# Patient Record
Sex: Female | Born: 1980 | Hispanic: No | Marital: Married | Smoking: Never smoker
Health system: Southern US, Community
[De-identification: ages and names within clinical notes are randomized; demographics above are authoritative.]

## PROBLEM LIST (undated history)

## (undated) DIAGNOSIS — Z789 Other specified health status: Secondary | ICD-10-CM

## (undated) HISTORY — PX: TOOTH EXTRACTION: SHX859

## (undated) HISTORY — PX: HAND SURGERY: SHX662

---

## 2006-06-09 ENCOUNTER — Inpatient Hospital Stay (HOSPITAL_COMMUNITY): Admission: AD | Admit: 2006-06-09 | Discharge: 2006-06-11 | Payer: Self-pay | Admitting: Gynecology

## 2006-06-09 ENCOUNTER — Ambulatory Visit: Payer: Self-pay | Admitting: Certified Nurse Midwife

## 2007-05-26 ENCOUNTER — Emergency Department (HOSPITAL_COMMUNITY): Admission: EM | Admit: 2007-05-26 | Discharge: 2007-05-26 | Payer: Self-pay | Admitting: Emergency Medicine

## 2008-01-28 ENCOUNTER — Inpatient Hospital Stay (HOSPITAL_COMMUNITY): Admission: AD | Admit: 2008-01-28 | Discharge: 2008-01-28 | Payer: Self-pay | Admitting: Family Medicine

## 2008-04-16 ENCOUNTER — Ambulatory Visit (HOSPITAL_COMMUNITY): Admission: RE | Admit: 2008-04-16 | Discharge: 2008-04-16 | Payer: Self-pay | Admitting: Family Medicine

## 2008-04-22 ENCOUNTER — Ambulatory Visit: Payer: Self-pay | Admitting: Obstetrics & Gynecology

## 2008-05-13 ENCOUNTER — Ambulatory Visit: Payer: Self-pay | Admitting: Family Medicine

## 2008-05-20 ENCOUNTER — Ambulatory Visit: Payer: Self-pay | Admitting: Family Medicine

## 2008-05-25 ENCOUNTER — Ambulatory Visit (HOSPITAL_COMMUNITY): Admission: RE | Admit: 2008-05-25 | Discharge: 2008-05-25 | Payer: Self-pay | Admitting: Family Medicine

## 2008-06-03 ENCOUNTER — Ambulatory Visit: Payer: Self-pay | Admitting: Family Medicine

## 2008-06-17 ENCOUNTER — Ambulatory Visit: Payer: Self-pay | Admitting: Obstetrics & Gynecology

## 2008-07-01 ENCOUNTER — Ambulatory Visit (HOSPITAL_COMMUNITY): Admission: RE | Admit: 2008-07-01 | Discharge: 2008-07-01 | Payer: Self-pay | Admitting: Obstetrics & Gynecology

## 2008-07-08 ENCOUNTER — Ambulatory Visit: Payer: Self-pay | Admitting: Obstetrics & Gynecology

## 2008-07-08 LAB — CONVERTED CEMR LAB
Hemoglobin: 13.1 g/dL (ref 12.0–15.0)
RBC: 4.24 M/uL (ref 3.87–5.11)
WBC: 9 10*3/uL (ref 4.0–10.5)

## 2008-07-22 ENCOUNTER — Ambulatory Visit: Payer: Self-pay | Admitting: Obstetrics & Gynecology

## 2008-07-26 ENCOUNTER — Ambulatory Visit: Payer: Self-pay | Admitting: Obstetrics & Gynecology

## 2008-07-26 ENCOUNTER — Ambulatory Visit (HOSPITAL_COMMUNITY): Admission: RE | Admit: 2008-07-26 | Discharge: 2008-07-26 | Payer: Self-pay | Admitting: Obstetrics & Gynecology

## 2008-07-29 ENCOUNTER — Ambulatory Visit: Payer: Self-pay | Admitting: Obstetrics & Gynecology

## 2008-08-02 ENCOUNTER — Ambulatory Visit: Payer: Self-pay | Admitting: Obstetrics & Gynecology

## 2008-08-05 ENCOUNTER — Ambulatory Visit: Payer: Self-pay | Admitting: Obstetrics & Gynecology

## 2008-08-05 ENCOUNTER — Ambulatory Visit (HOSPITAL_COMMUNITY): Admission: RE | Admit: 2008-08-05 | Discharge: 2008-08-05 | Payer: Self-pay | Admitting: Family Medicine

## 2008-08-16 ENCOUNTER — Ambulatory Visit: Payer: Self-pay | Admitting: Obstetrics & Gynecology

## 2008-08-19 ENCOUNTER — Ambulatory Visit: Payer: Self-pay | Admitting: Obstetrics & Gynecology

## 2008-08-23 ENCOUNTER — Ambulatory Visit: Payer: Self-pay | Admitting: Obstetrics & Gynecology

## 2008-08-25 ENCOUNTER — Ambulatory Visit: Payer: Self-pay | Admitting: Obstetrics & Gynecology

## 2008-08-25 ENCOUNTER — Inpatient Hospital Stay (HOSPITAL_COMMUNITY): Admission: AD | Admit: 2008-08-25 | Discharge: 2008-08-28 | Payer: Self-pay | Admitting: Obstetrics and Gynecology

## 2008-08-26 ENCOUNTER — Encounter: Payer: Self-pay | Admitting: Obstetrics and Gynecology

## 2008-10-12 ENCOUNTER — Ambulatory Visit: Payer: Self-pay | Admitting: Advanced Practice Midwife

## 2008-10-12 ENCOUNTER — Inpatient Hospital Stay (HOSPITAL_COMMUNITY): Admission: AD | Admit: 2008-10-12 | Discharge: 2008-10-12 | Payer: Self-pay | Admitting: Obstetrics & Gynecology

## 2008-12-21 ENCOUNTER — Ambulatory Visit: Payer: Self-pay | Admitting: Family Medicine

## 2008-12-21 DIAGNOSIS — R002 Palpitations: Secondary | ICD-10-CM

## 2009-02-01 ENCOUNTER — Telehealth: Payer: Self-pay | Admitting: Family Medicine

## 2009-02-02 ENCOUNTER — Ambulatory Visit: Payer: Self-pay | Admitting: Family Medicine

## 2009-02-02 DIAGNOSIS — J029 Acute pharyngitis, unspecified: Secondary | ICD-10-CM

## 2009-02-02 LAB — CONVERTED CEMR LAB: Rapid Strep: NEGATIVE

## 2009-03-08 ENCOUNTER — Ambulatory Visit: Payer: Self-pay | Admitting: Family Medicine

## 2009-04-26 ENCOUNTER — Telehealth: Payer: Self-pay | Admitting: Family Medicine

## 2009-04-26 ENCOUNTER — Encounter: Payer: Self-pay | Admitting: Family Medicine

## 2009-04-26 ENCOUNTER — Ambulatory Visit: Payer: Self-pay | Admitting: Family Medicine

## 2009-04-26 DIAGNOSIS — R300 Dysuria: Secondary | ICD-10-CM

## 2009-04-27 ENCOUNTER — Encounter: Payer: Self-pay | Admitting: Family Medicine

## 2009-04-28 ENCOUNTER — Telehealth (INDEPENDENT_AMBULATORY_CARE_PROVIDER_SITE_OTHER): Payer: Self-pay | Admitting: *Deleted

## 2009-04-29 ENCOUNTER — Encounter: Payer: Self-pay | Admitting: Family Medicine

## 2009-06-11 IMAGING — US US OB DETAIL+14 WK
1 series · 14 of 28 positions shown · non-contrast
Comparison: none

OBSTETRICAL ULTRASOUND:
 This ultrasound exam was performed in the [HOSPITAL] Ultrasound Department.  The OB US report was generated in the AS system, and faxed to the ordering physician.  This report is also available in [REDACTED] PACS.

[Series 1: us ob detail +14 wk · 14 of 159 slices shown]
[im 6/159]
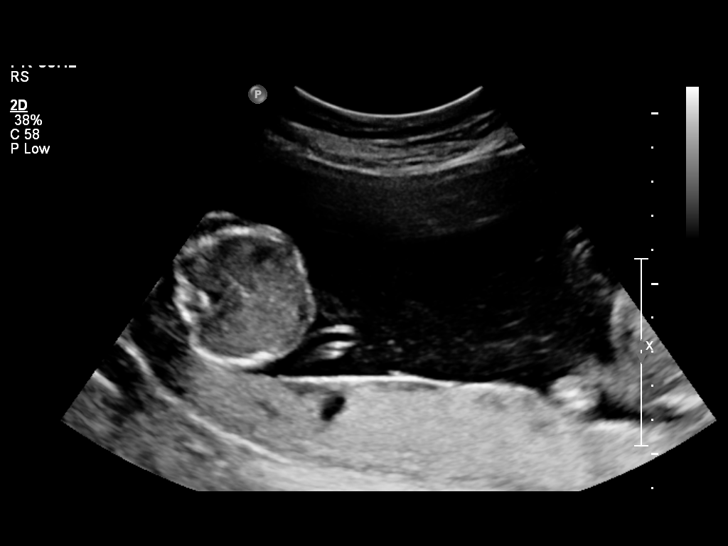
[im 18/159]
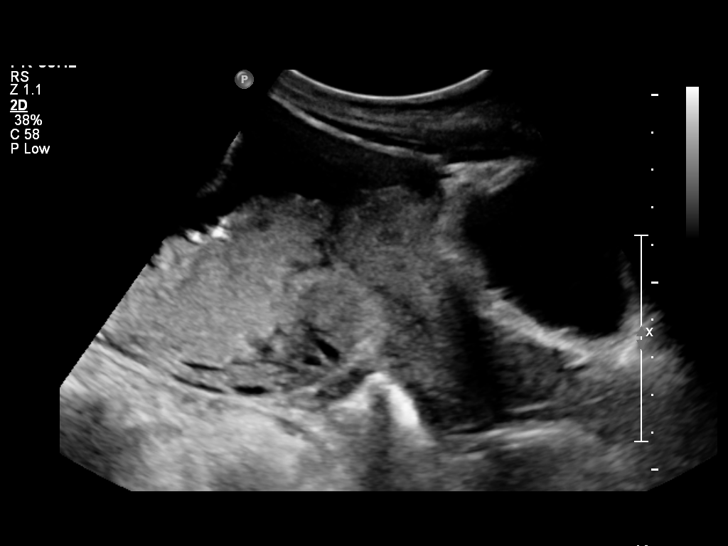
[im 30/159]
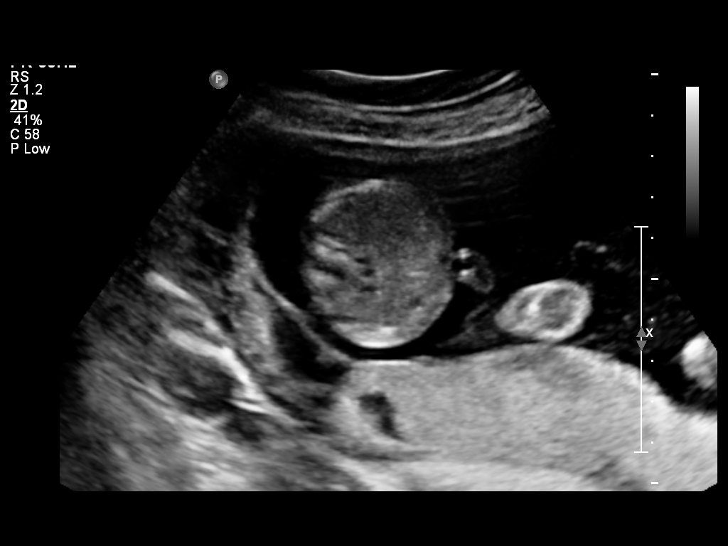
[im 41/159]
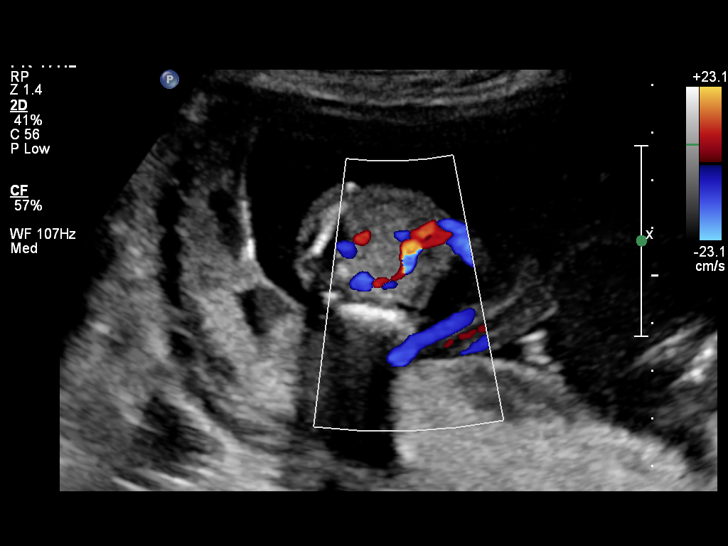
[im 53/159]
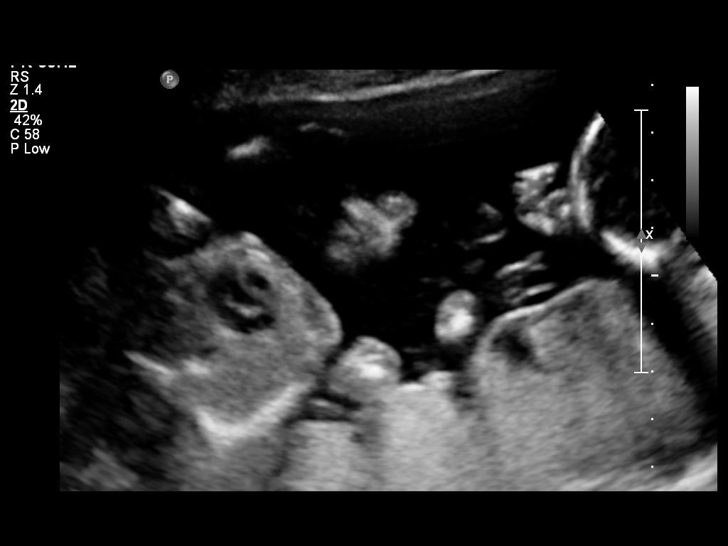
[im 65/159]
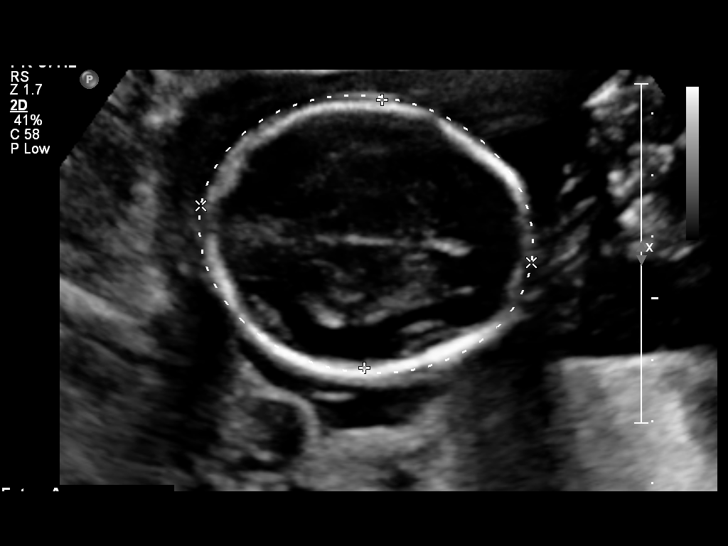
[im 77/159]
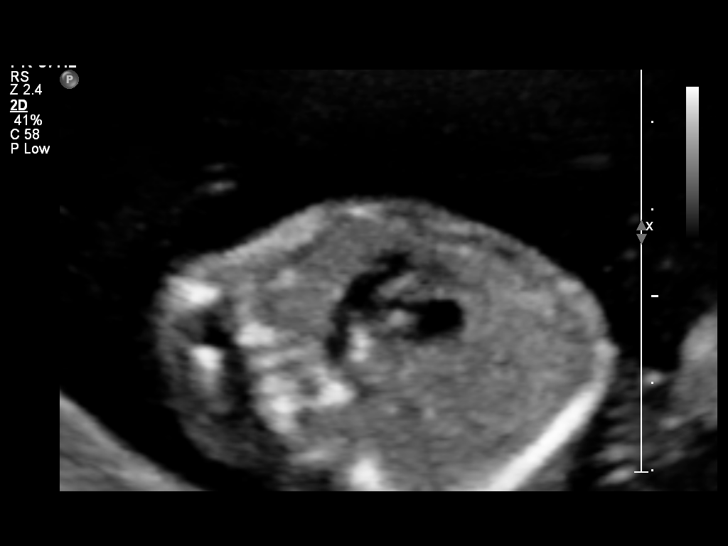
[im 88/159]
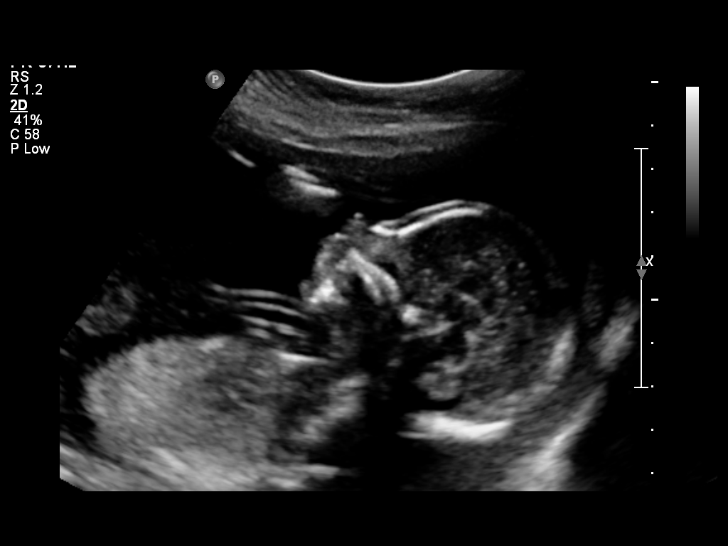
[im 100/159]
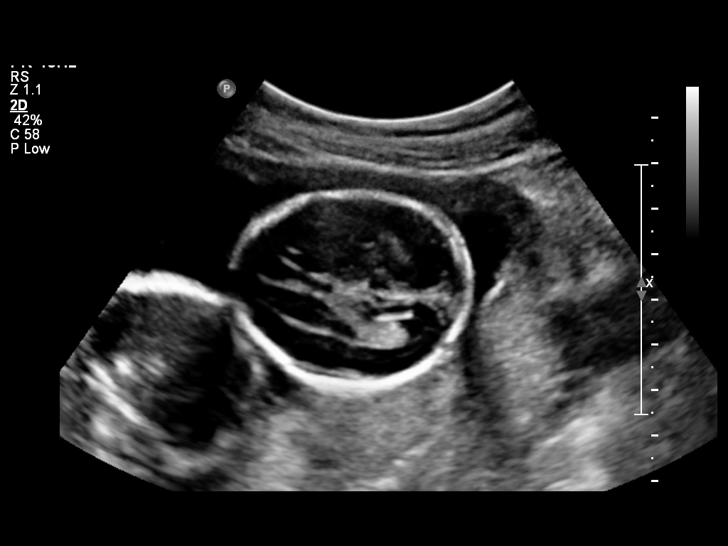
[im 112/159]
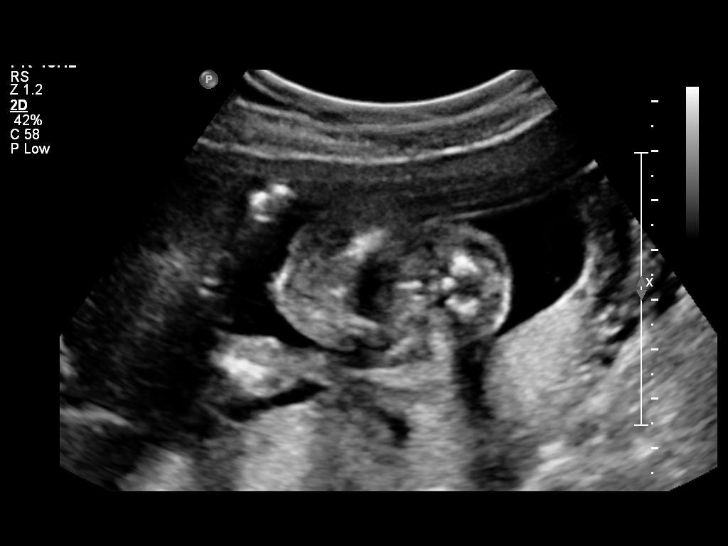
[im 123/159]
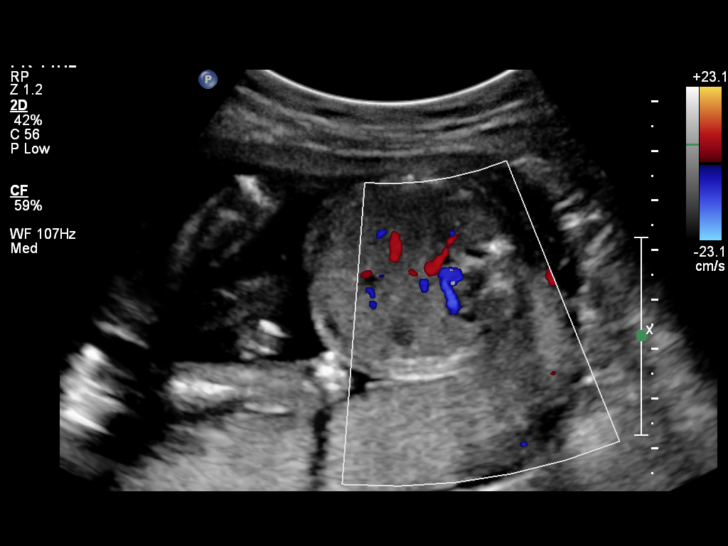
[im 135/159]
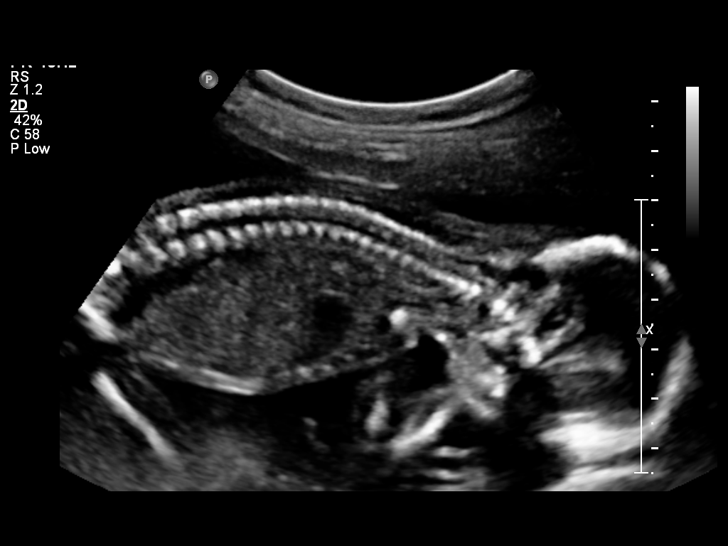
[im 147/159]
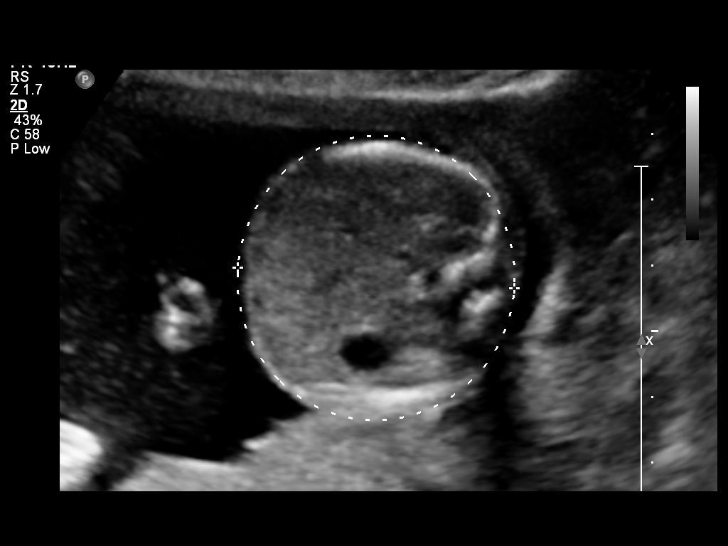
[im 159/159]
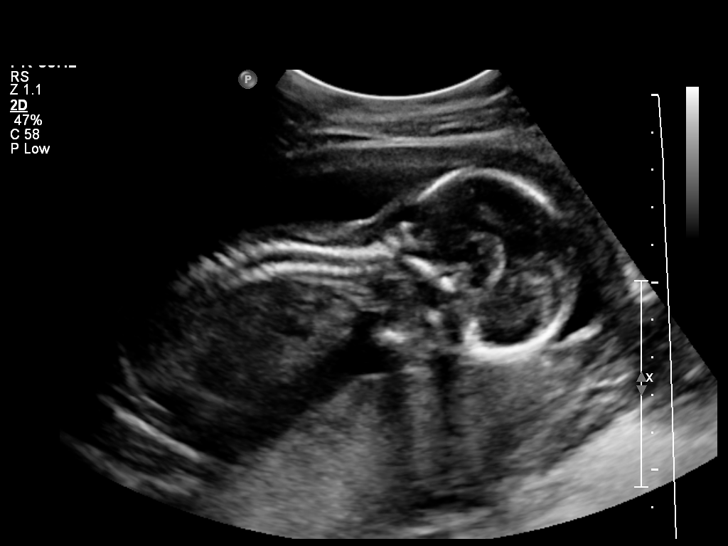

[14 of 28 positions shown; findings below may reference images not displayed]

IMPRESSION: See AS Obstetric US report.

## 2009-07-13 ENCOUNTER — Telehealth: Payer: Self-pay | Admitting: Family Medicine

## 2009-08-26 IMAGING — US US OB FOLLOW-UP
1 series · 14 of 28 positions shown · non-contrast
Comparison: none

OBSTETRICAL ULTRASOUND:
 This ultrasound exam was performed in the [HOSPITAL] Ultrasound Department.  The OB US report was generated in the AS system, and faxed to the ordering physician.  This report is also available in [REDACTED] PACS.

[Series 1: us ob follow up add'left gest · 0.20mm/px · 62 acquisitions, 14 frames shown]
[im 3/62]
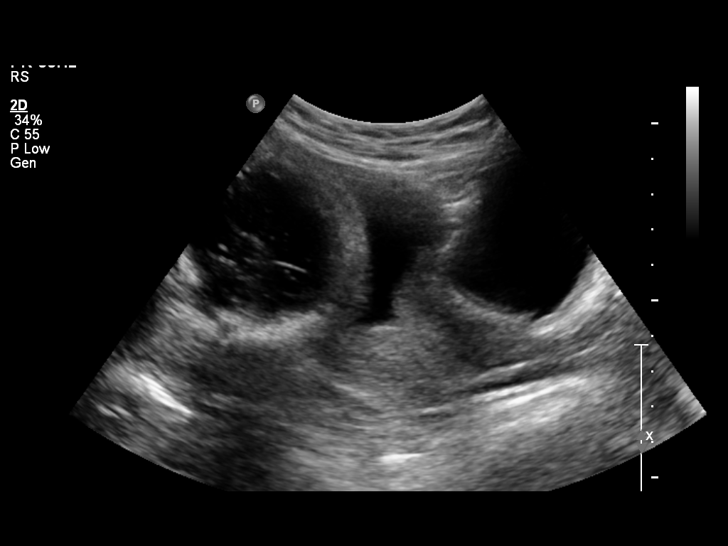
[im 7/62]
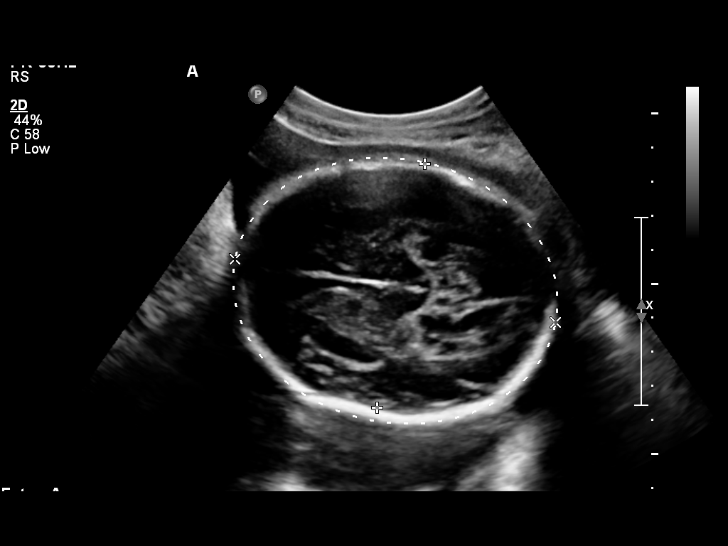
[im 12/62]
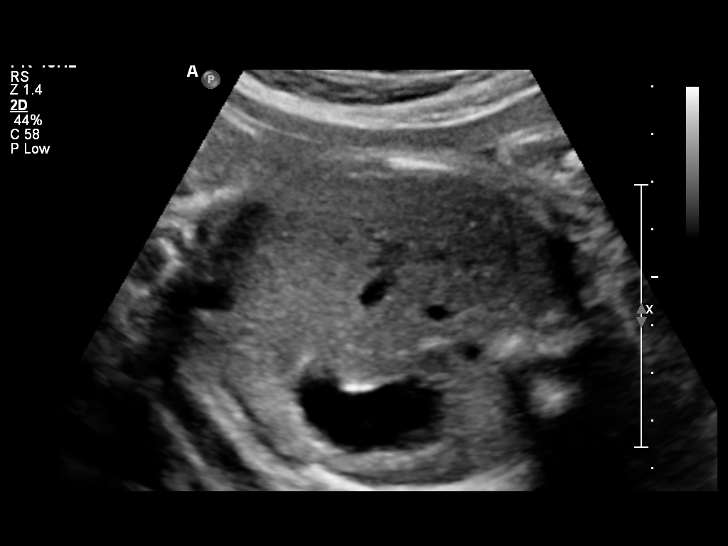
[im 16/62]
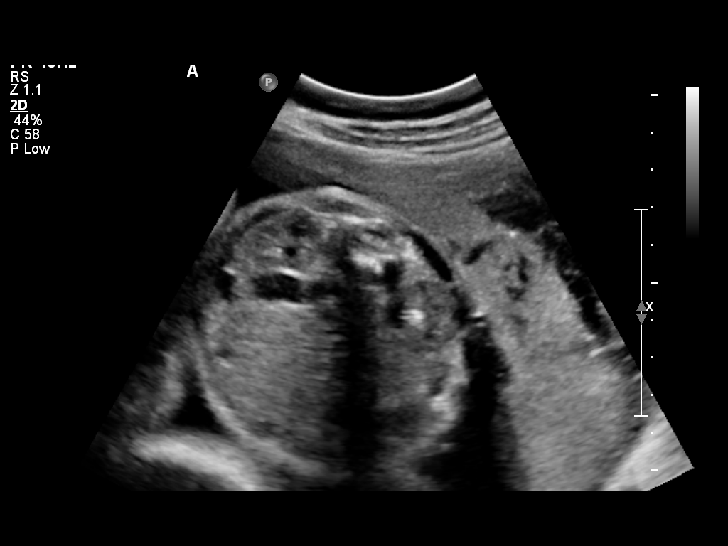
[im 21/62]
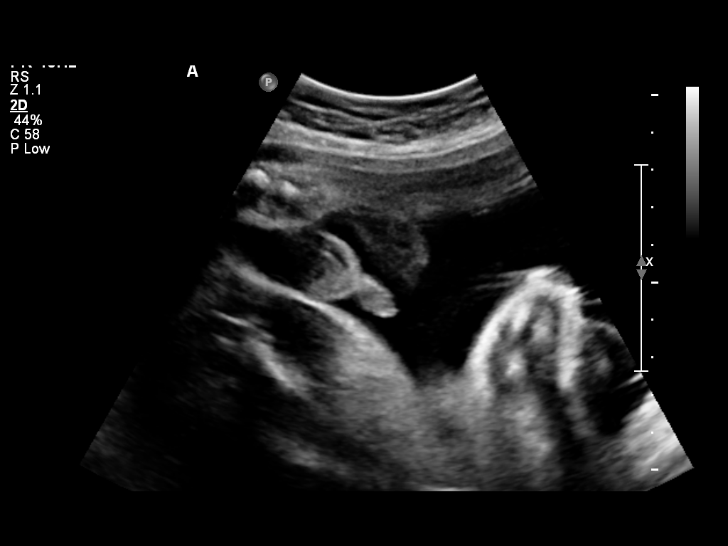
[im 25/62]
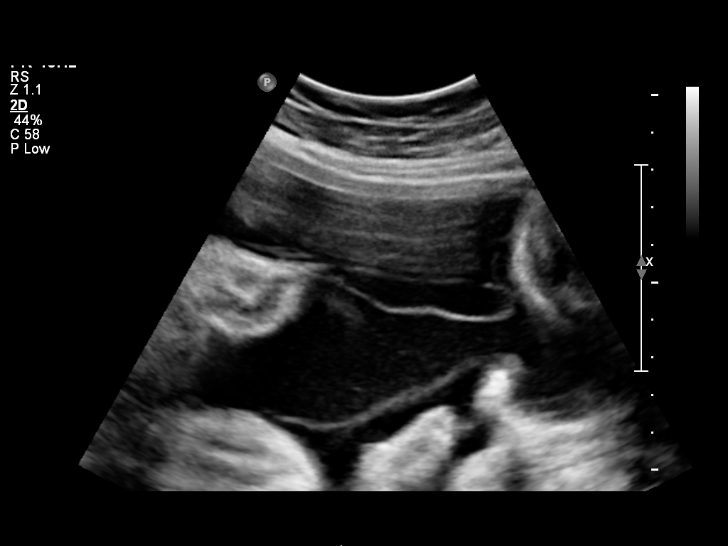
[im 30/62]
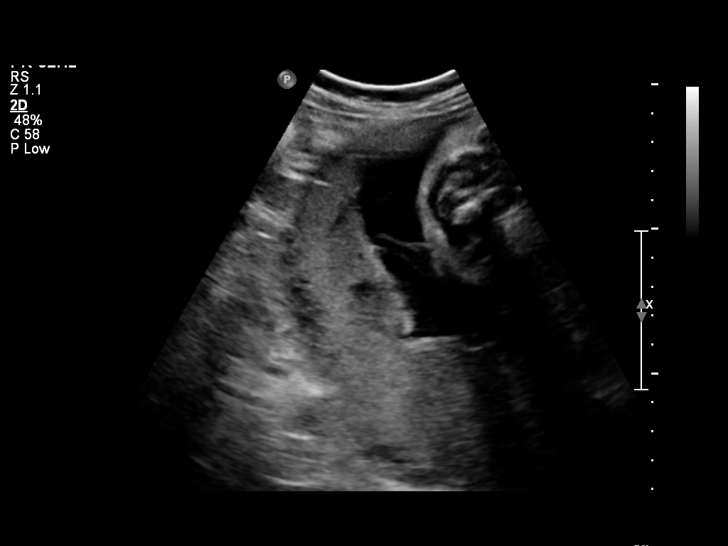
[im 34/62]
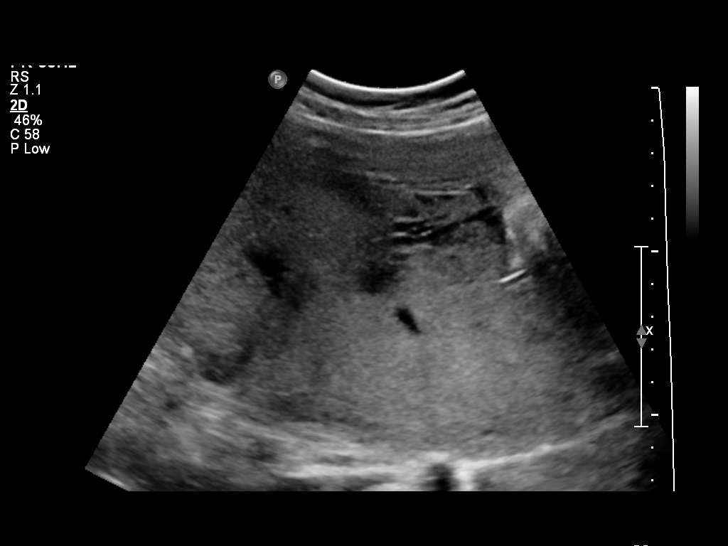
[im 39/62]
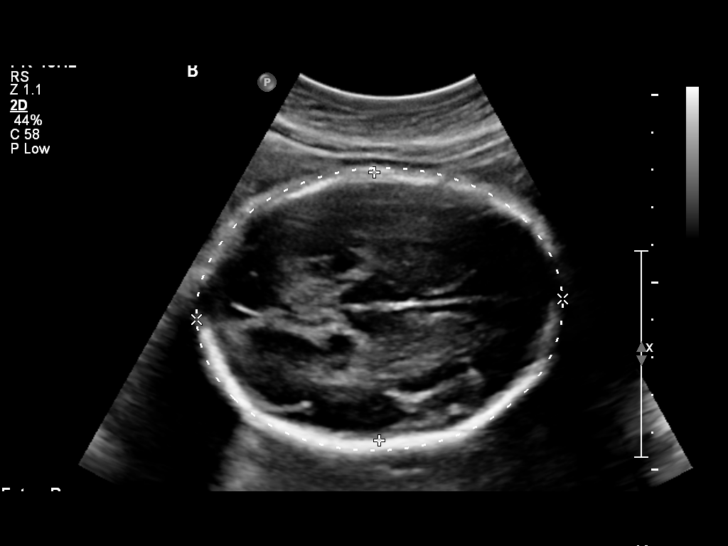
[im 43/62]
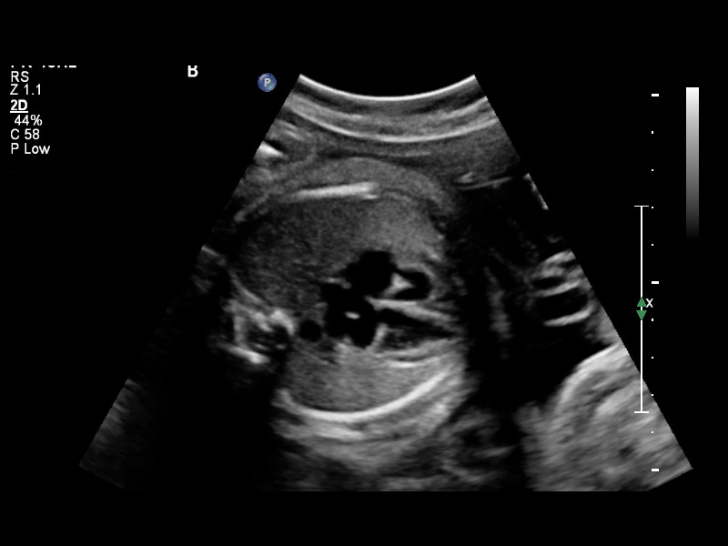
[im 48/62]
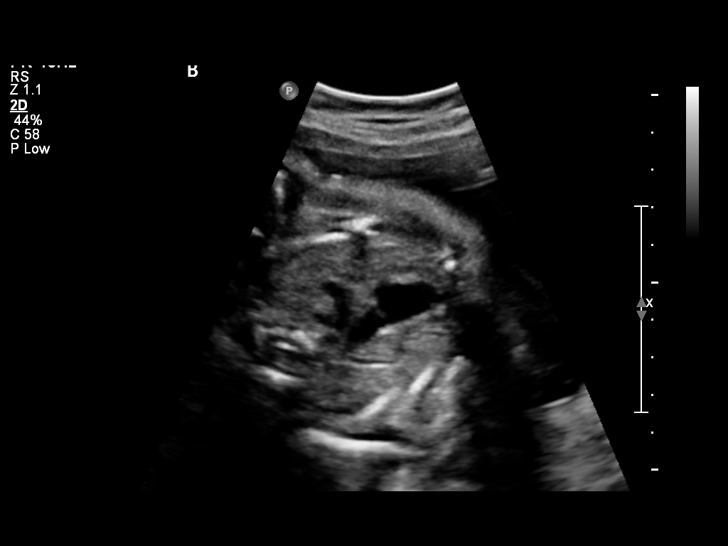
[im 52/62]
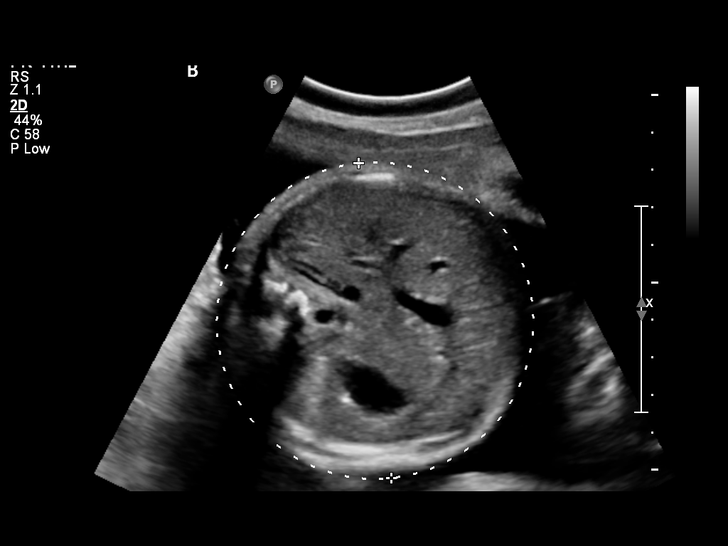
[im 57/62]
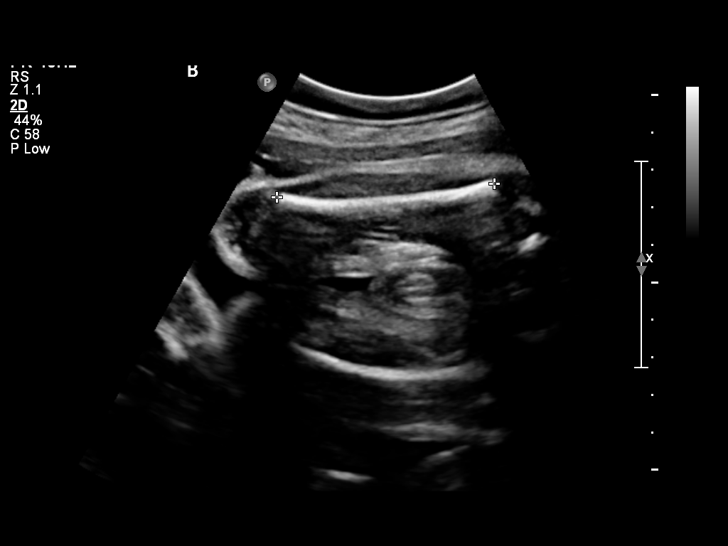
[im 62/62]
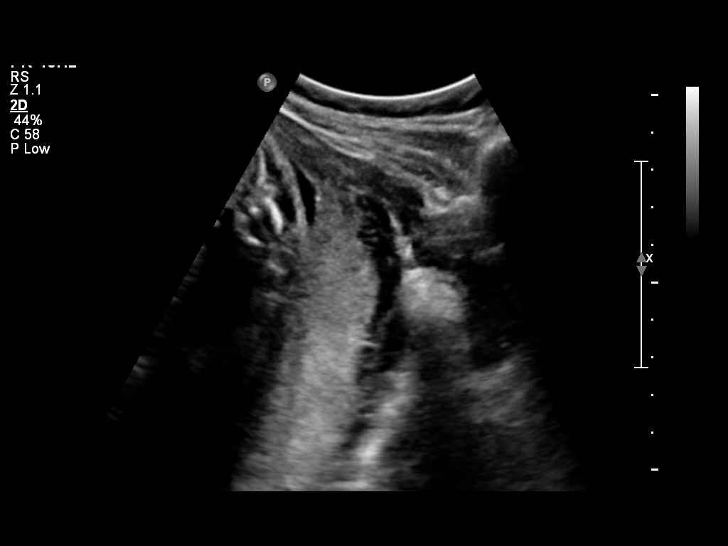

[14 of 28 positions shown; findings below may reference images not displayed]

IMPRESSION: See AS Obstetric US report.

## 2009-10-12 ENCOUNTER — Telehealth: Payer: Self-pay | Admitting: *Deleted

## 2009-12-19 ENCOUNTER — Telehealth: Payer: Self-pay | Admitting: Family Medicine

## 2010-01-25 ENCOUNTER — Encounter: Payer: Self-pay | Admitting: Family Medicine

## 2010-01-25 ENCOUNTER — Ambulatory Visit: Payer: Self-pay | Admitting: Family Medicine

## 2010-01-25 DIAGNOSIS — O91219 Nonpurulent mastitis associated with pregnancy, unspecified trimester: Secondary | ICD-10-CM | POA: Insufficient documentation

## 2010-02-26 ENCOUNTER — Encounter: Payer: Self-pay | Admitting: *Deleted

## 2010-03-07 NOTE — Progress Notes (Signed)
Summary: Rx Prob  Phone Note Call from Patient Call back at Home Phone [phone]   Caller: Patient Summary of Call: [facility] of [facility] due to cost. Initial call taken by: Clydell Hakim,  April 28, 2009 9:22 AM  Follow-up for Phone Call        cancelled Rx at [facility] and  then called to [facility] pharmacy.  patient notified. Follow-up by: Theresia Lo RN,  April 28, 2009 10:06 AM

## 2010-03-07 NOTE — Progress Notes (Signed)
Summary: refill  Phone Note Refill Request Call back at Home Phone [phone] Message from:  Patient  Refills Requested: Medication #1:  SPRINTEC 28 0.25-35 MG-MCG TABS 1 tab by mouth daily for birth control; dispense 3 packs. [facility] [address] needs generic to make it affordable  Initial call taken by: De Nurse,  July 13, 2009 3:30 PM  Follow-up for Phone Call        to pcp to see if there is a generic equivilant Follow-up by: Golden Circle RN,  July 13, 2009 3:31 PM     Appended Document: refill pt needs to know about her meds

## 2010-03-07 NOTE — Progress Notes (Signed)
Summary: phone note  Phone Note Refill Request Call back at Home Phone [phone] Message from:  Patient  prenatal vitamins for breastfeeding - [facility] [address]  Initial call taken by: De Nurse,  December 19, 2009 2:05 PM  Follow-up for Phone Call        pt does not need to take special prenatal vitamins while breastfeeding.  Pt only needs to take multivitamin which she can by over the counter. I called pt and discussed this with her.  she also requested refill of her bcp which I refilled.  Ellin Mayhew MD  December 20, 2009 1:36 PM     Prescriptions: SPRINTEC 28 0.25-35 MG-MCG TABS (NORGESTIMATE-ETH ESTRADIOL) 1 tab by mouth daily for birth control; dispense 3 packs  #1 x 11   Entered and Authorized by:   Ellin Mayhew MD   Signed by:   Ellin Mayhew MD on 12/20/2009   Method used:   Electronically to        [facility] [address] (retail)       [phone]       [place], [place]  [address]       Ph: [phone]       Fax: [phone]   RxID:   [id number]

## 2010-03-07 NOTE — Assessment & Plan Note (Signed)
Summary: tooth fell out and now infected area,tcb   Vital Signs:  Patient profile:   30 year old female Weight:      127 pounds Temp:     98.1 degrees F Pulse rate:   102 / minute BP sitting:   123 / 72  Vitals Entered By: Jone Baseman CMA (March 08, 2009 10:37 AM) CC: tooth fell out and now possible infection   Primary Care Provider:  Ellin Mayhew MD  CC:  tooth fell out and now possible infection.  History of Present Illness: Patient here for c/o dental pain/infection.  She states molar R upper, fell out several months ago.  Now bits of the tooth seem to be coming out when she eats, associated with subjective fever, headache, face pain, peeling gums.  She needs a dental referral for extraction of remaining root of tooth.   Allergies: No Known Drug Allergies  Physical Exam  General:  Well-developed,well-nourished,in no acute distress; alert,appropriate and cooperative throughout examination Head:  tenderness along R maxilla and mandible Mouth:  Tooth #4 broken down to gumline, root decayed and embedded in gum, small fragments mobile, gum above inflammed and erythematous   Impression & Recommendations:  Problem # 1:  BROKEN TOOTH, INFECTED (ICD-873.73)  Rx for Penicillin and Ibuprofen. Referral to [facility] for evaluation and treatment.  Orders: [facility]- Est Level  3 ([address]) Dental Referral (Dentist)  Complete Medication List: 1)  Penicillin V Potassium 500 Mg Tabs (Penicillin v potassium) .... One tablet by mouth q6 hours x 5 days 2)  Ibuprofen 600 Mg Tabs (Ibuprofen) .... One tablet by mouth q6 hours as needed for pain Prescriptions: IBUPROFEN 600 MG TABS (IBUPROFEN) one tablet by mouth q6 hours as needed for pain  #30 x 0   Entered and Authorized by:   Jettie Pagan MD   Signed by:   Jettie Pagan MD on 03/08/2009   Method used:   Print then Give to Patient   RxID:   [id number] PENICILLIN V POTASSIUM 500 MG TABS (PENICILLIN V POTASSIUM) one tablet by  mouth q6 hours x 5 days  #20 x 0   Entered and Authorized by:   Jettie Pagan MD   Signed by:   Jettie Pagan MD on 03/08/2009   Method used:   Print then Give to Patient   RxID:   [id number]

## 2010-03-07 NOTE — Progress Notes (Signed)
Summary: phn msg  Phone Note Call from Patient Call back at [facility] Phone [phone]   Caller: Patient Summary of Call: has a question about mirena Initial call taken by: De Nurse,  April 26, 2009 9:42 AM  Follow-up for Phone Call        has the mirena. c/o pain & burning. wants it out now. appt with Dr. Lafonda Mosses at 11:15. wants female mds only. advised to take tylenol prior to appt Follow-up by: Golden Circle RN,  April 26, 2009 10:00 AM

## 2010-03-07 NOTE — Progress Notes (Signed)
Summary: Rx Req  Phone Note Refill Request Call back at Home Phone [phone] Message from:  Robin Wagner  Refills Requested: Medication #1:  SPRINTEC 28 0.25-35 MG-MCG TABS 1 tab by mouth daily for birth control; dispense 3 packs. [facility].  Initial call taken by: Clydell Hakim,  October 12, 2009 11:04 AM  Follow-up for Phone Call        will forward  to PCP. Follow-up by: Theresia Lo RN,  October 12, 2009 11:30 AM  Additional Follow-up for Phone Call Additional follow up Details #1::       Additional Follow-up by: Golden Circle RN,  October 18, 2009 12:36 PM    Additional Follow-up for Phone Call Additional follow up Details #2::    Robin Wagner called last Thursday regarding Sprintec refill.  She is currently out of meds.  Would like for refill to be called to pharmacy to [facility] [address].  Please call Robin Wagner when ready.  [phone]  Follow-up by: Abundio Miu  Prescriptions: SPRINTEC 28 0.25-35 MG-MCG TABS (NORGESTIMATE-ETH ESTRADIOL) 1 tab by mouth daily for birth control; dispense 3 packs  #1 x 2   Entered by:   Golden Circle RN   Authorized by:   Ellin Mayhew MD   Signed by:   Golden Circle RN on 10/18/2009   Method used:   Electronically to        [facility] [address] (retail)       [phone]       [place], [place]  [address]       Ph: [phone]       Fax: [phone]   RxID:   [id number]

## 2010-03-07 NOTE — Letter (Signed)
Summary: Generic Letter  [facility]  [address]   [place], [place] [address]   Phone: [phone]  Fax: [phone]    04/29/2009  Robin Wagner [address] [place], [place]  [address]  Dear [name],  I just wanted to let you know that your urine test was normal.  Please call me if you have any questions or concerns.           Sincerely,   Asher Muir MD  Appended Document: Generic Letter mailed.

## 2010-03-07 NOTE — Miscellaneous (Signed)
Summary: Consent IUD Removal  Consent IUD Removal   Imported By: Clydell Hakim 05/13/2009 10:57:49  _____________________________________________________________________  External Attachment:    Type:   Image     Comment:   External Document

## 2010-03-07 NOTE — Assessment & Plan Note (Signed)
Summary: wants IUD out now/[place]/caviness   Primary Care Provider:  Ellin Mayhew MD  CC:  wants iud out.  History of Present Illness: 1.  wants IUD out--strings bother her.  burning with urination and has burning sensation other times too.  irregular bleeding.  interested in ocps.  non-smoker.  no personal or family history of clots  Current Medications (verified): 1)  None  Allergies: No Known Drug Allergies  Review of Systems General:  Denies chills, fever, and weight loss. GI:  Denies abdominal pain. GU:  Complains of dysuria; denies incontinence and urinary frequency.  Physical Exam  General:  Well-developed,well-nourished,in no acute distress; alert,appropriate and cooperative throughout examination Genitalia:  Pelvic Exam:        External: normal female genitalia without lesions or masses        Vagina: normal without lesions or masses        Cervix: normal without lesions or masses.  IUD strings in place Additional Exam:  vital signs reviewed    Impression & Recommendations:  Problem # 1:  CONTRACEPTIVE MANAGEMENT (ICD-V25.09) Assessment New  IUD removed as requested.  OCPs prescribed.    procedure note:  informed consent obtained.  time out completed.  IUD removed with gentle traction.  no complications.    Orders: [facility]- Est Level  3 ([address]) IUD removal -[facility] ([address])  Problem # 2:  DYSURIA (ICD-788.1) Assessment: New urine culture to rule out uti as cause of her dysuria Orders: Urine Culture-[facility] ([id number]) [facility]- Est Level  3 ([address])  Complete Medication List: 1)  Sprintec 28 0.25-35 Mg-mcg Tabs (Norgestimate-eth estradiol) .Marland Kitchen.. 1 tab by mouth daily for birth control; dispense 3 packs  Patient Instructions: 1)  It was nice to see you today. 2)  I will call you if there is a problem with your urine culture. 3)  Start taking your birth control pills.  I sent the prescription to your pharmacy.  Use condoms for the next week. 4)  Call me if you have any  problems or questions. Prescriptions: SPRINTEC 28 0.25-35 MG-MCG TABS (NORGESTIMATE-ETH ESTRADIOL) 1 tab by mouth daily for birth control; dispense 3 packs  #1 x 4   Entered and Authorized by:   Asher Muir MD   Signed by:   Asher Muir MD on 04/26/2009   Method used:   Electronically to        [facility]  [phone]* (retail)       [address]       [place], [place]  [address]       Ph: [phone] or [phone]       Fax: [phone]   RxID:   [id number]

## 2010-03-09 NOTE — Assessment & Plan Note (Signed)
Summary: body aches/ts   Vital Signs:  Patient profile:   30 year old female Weight:      127.5 pounds Temp:     99.5 degrees F oral Pulse rate:   70 / minute BP sitting:   112 / 79  (left arm)  Vitals Entered By: Arlyss Repress CMA, (January 25, 2010 4:13 PM) CC: body aches x 2 days. knot in right breast x 2 days. breast feeding. Is Patient Diabetic? No Pain Assessment Patient in pain? yes     Location: body Intensity: 6 Onset of pain  x2days   Primary Provider:  Ellin Mayhew MD  CC:  body aches x 2 days. knot in right breast x 2 days. breast feeding.Marland Kitchen  History of Present Illness: Very sweet pt p/w breast pain and swelling as well as body aches x2-3 days.  Still breastfeeding her 79 month old twin boys!  States she has had mastitis before and this is what it felt like.  Achy all over, but does not think she has had a true fever.  Is able to breast feed but right breast is enlarged compared to left.  She has felt a knot.  Otherwise, no complaints.   Current Medications (verified): 1)  Sprintec 28 0.25-35 Mg-Mcg Tabs (Norgestimate-Eth Estradiol) .Marland Kitchen.. 1 Tab By Mouth Daily For Birth Control; Dispense 3 Packs  Allergies (verified): No Known Drug Allergies  Physical Exam  General:  alert, well-developed, well-nourished, well-hydrated, normal appearance, healthy-appearing, cooperative to examination, and good hygiene.   Additional Exam:  Right breast swollen compared to left. Warm, nonerythematous, TTP. 2x2cm small, firm knot/mass was felt, but no fluctuence or other concerns for abscess.     Impression & Recommendations:  Problem # 1:  MASTITIS, LACTATING (ICD-675.20) Breast tenderness and swelling likely 2/2 mastitis; exam not c/w abscess.  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  Encouraged cool compresses and anti-inflammatory such as tylenol for pain/body aches. Orders: [facility]- Est Level  3 ([address])  Complete Medication List: 1)  Sprintec 28  0.25-35 Mg-mcg Tabs (Norgestimate-eth estradiol) .Marland Kitchen.. 1 tab by mouth daily for birth control; dispense 3 packs 2)  Bactrim Ds 800-160 Mg Tabs (Sulfamethoxazole-trimethoprim) .Marland Kitchen.. 1 tab by mouth two times a day x10 days  Patient Instructions: 1)  I agree that this is likely mastitis again. 2)  I am giving you an antibiotic to help clear it up. Please take the medicine for 10 days total.  You can breastfeed while taking the antibiotic. 3)  Please come back and see [place] if it is not improving in a few days, or if you begin to get high fevers, worsening pain, or if you feel like there is an abscess forming under your skin. 4)  Have a great holiday!! Prescriptions: BACTRIM DS 800-160 MG TABS (SULFAMETHOXAZOLE-TRIMETHOPRIM) 1 tab by mouth two times a day x10 days  #20 x 0   Entered and Authorized by:   Demetria Pore MD   Signed by:   Demetria Pore MD on 01/25/2010   Method used:   Print then Give to Patient   RxID:   [id number]    Orders Added: 1)  [facility]- Est Level  3 [[address]]

## 2010-03-14 ENCOUNTER — Encounter: Payer: Self-pay | Admitting: *Deleted

## 2010-05-14 LAB — POCT URINALYSIS DIP (DEVICE)
Bilirubin Urine: NEGATIVE
Bilirubin Urine: NEGATIVE
Hgb urine dipstick: NEGATIVE
Hgb urine dipstick: NEGATIVE
Ketones, ur: NEGATIVE mg/dL
Ketones, ur: NEGATIVE mg/dL
Nitrite: NEGATIVE
Protein, ur: 30 mg/dL — AB
Specific Gravity, Urine: 1.015 (ref 1.005–1.030)
pH: 7 (ref 5.0–8.0)
pH: 7 (ref 5.0–8.0)

## 2010-05-14 LAB — CBC
HCT: 38.7 % (ref 36.0–46.0)
Hemoglobin: 11.4 g/dL — ABNORMAL LOW (ref 12.0–15.0)
Hemoglobin: 13.6 g/dL (ref 12.0–15.0)
MCHC: 35.6 g/dL (ref 30.0–36.0)
MCV: 91.9 fL (ref 78.0–100.0)
Platelets: 154 10*3/uL (ref 150–400)
RBC: 3.48 MIL/uL — ABNORMAL LOW (ref 3.87–5.11)
WBC: 10.4 10*3/uL (ref 4.0–10.5)
WBC: 9.9 10*3/uL (ref 4.0–10.5)

## 2010-05-15 LAB — POCT URINALYSIS DIP (DEVICE)
Glucose, UA: NEGATIVE mg/dL
Hgb urine dipstick: NEGATIVE
Hgb urine dipstick: NEGATIVE
Hgb urine dipstick: NEGATIVE
Ketones, ur: NEGATIVE mg/dL
Nitrite: NEGATIVE
Nitrite: NEGATIVE
Protein, ur: 30 mg/dL — AB
Protein, ur: NEGATIVE mg/dL
Protein, ur: NEGATIVE mg/dL
Specific Gravity, Urine: 1.01 (ref 1.005–1.030)
Specific Gravity, Urine: 1.02 (ref 1.005–1.030)
Urobilinogen, UA: 0.2 mg/dL (ref 0.0–1.0)
Urobilinogen, UA: 0.2 mg/dL (ref 0.0–1.0)
pH: 6 (ref 5.0–8.0)
pH: 7 (ref 5.0–8.0)
pH: 7 (ref 5.0–8.0)

## 2010-05-16 LAB — POCT URINALYSIS DIP (DEVICE)
Protein, ur: NEGATIVE mg/dL
Urobilinogen, UA: 0.2 mg/dL (ref 0.0–1.0)
pH: 7.5 (ref 5.0–8.0)

## 2010-05-17 LAB — POCT URINALYSIS DIP (DEVICE)
Bilirubin Urine: NEGATIVE
Glucose, UA: NEGATIVE mg/dL
Glucose, UA: NEGATIVE mg/dL
Hgb urine dipstick: NEGATIVE
Hgb urine dipstick: NEGATIVE
Ketones, ur: NEGATIVE mg/dL
Nitrite: NEGATIVE
Nitrite: NEGATIVE
Nitrite: NEGATIVE
Protein, ur: NEGATIVE mg/dL
Protein, ur: 30 mg/dL — AB
Specific Gravity, Urine: 1.015 (ref 1.005–1.030)
Urobilinogen, UA: 0.2 mg/dL (ref 0.0–1.0)
Urobilinogen, UA: 0.2 mg/dL (ref 0.0–1.0)
pH: 7 (ref 5.0–8.0)
pH: 7 (ref 5.0–8.0)

## 2010-05-18 LAB — POCT URINALYSIS DIP (DEVICE)
Glucose, UA: NEGATIVE mg/dL
Nitrite: NEGATIVE
Protein, ur: NEGATIVE mg/dL
Urobilinogen, UA: 0.2 mg/dL (ref 0.0–1.0)

## 2010-06-20 NOTE — Discharge Summary (Signed)
[name], Robin Wagner              ACCOUNT NO.:  [id number]   MEDICAL RECORD NO.:  [id number]          PATIENT TYPE:  INP   LOCATION:  9147                          FACILITY:  [facility]   PHYSICIAN:  Tilda Burrow, [name] DATE OF BIRTH:  [birth year]   DATE OF ADMISSION:  08/25/2008  DATE OF DISCHARGE:  08/28/2008                               DISCHARGE SUMMARY   ADMITTING DIAGNOSES:  1. Intrauterine pregnancy at 37 and 3/7 weeks.  2. Twin gestation.  3. Group B strep positive.  4. Favorable cervix.   DISCHARGE DIAGNOSES:  1. Intrauterine pregnancy at 37 and 3/7 weeks.  2. Twin gestation.  3. Group B strep positive.  4. Favorable cervix.   HOSPITAL COURSE:  The patient is a 30 year old gravida 2, para 1-0-0-1,  who was admitted at 69 and 3/7 weeks' gestation for induction of labor  with twin gestation.  Her pregnancy has [facility] and has been remarkable for:  1. Mono/di twins.  2. Group B strep positive.  3. Posterior previa that resolved.   Upon admission, her cervix was 2 cm, 60% effaced with vertex at a -0  station.  She was given penicillin for group B strep and was started on  Pitocin.  The patient received an epidural by late morning.  On August 26, 2008, the patient had progressed to 5 cm and was in active labor.  Fetal  heart rate tracings were reassuring.  In the late afternoon, the  patient's membranes were ruptured for clear fluid.  Later that night,  the patient progressed to complete dilation and was taken to the  operating room for delivery.  The patient delivered vaginally both  twins.  Twin A was a viable female, weight was 6 pounds 1 ounce.  He was  born at 2304, Apgars were 9 at 1 minute and 9 at 5 minutes.  Twin B was  also a viable female, weight of 6 pounds 2 ounces, delivered at 2315 in  the vertex presentation.  Both infants were taken to the full-term  nursery in good condition.  The patient received Cytotec 800 per rectum  prophylactically.  EBL  was approximately 400 mL.  The patient did not  have a laceration.  She was taken to the mother-baby unit and was doing  well.  By postpartum day #1, her vital signs were stable.  Hemoglobin  was 11.4 and it had been 13.6 predelivery.  She was breast-feeding.  Expressed desire to receive Depo-Provera in the hospital and then Mirena  at her postpartum visit.  By postpartum day #2, she continued to do well  and was deemed to have received the full benefit of her hospital stay  and she was discharged to home.   DISCHARGE MEDICATIONS:  1. Prenatal vitamin 1 p.o. daily.  2. Motrin 600 mg 1 p.o. q.6 h. p.r.n. pain.   DISCHARGE INSTRUCTIONS:  Per postpartum handout and discharge follow up  will occur at the health department in 6 weeks or as needed.      Cam Hai, C.N.M.  Tilda Burrow, [name]  Electronically Signed    KS/MEDQ  D:  08/28/2008  T:  08/28/2008  Job:  [id number]

## 2010-07-14 ENCOUNTER — Ambulatory Visit: Payer: Self-pay | Admitting: Family Medicine

## 2010-07-17 ENCOUNTER — Encounter: Payer: Self-pay | Admitting: Family Medicine

## 2010-07-17 ENCOUNTER — Other Ambulatory Visit (HOSPITAL_COMMUNITY)
Admission: RE | Admit: 2010-07-17 | Discharge: 2010-07-17 | Disposition: A | Discharge status: Home / Self Care | Payer: Self-pay | Source: Ambulatory Visit | Attending: Family Medicine | Admitting: Family Medicine

## 2010-07-17 ENCOUNTER — Ambulatory Visit (INDEPENDENT_AMBULATORY_CARE_PROVIDER_SITE_OTHER): Payer: Self-pay | Admitting: Family Medicine

## 2010-07-17 VITALS — BP 105/76 | HR 99 | Temp 98.5°F | Ht 65.0 in | Wt 124.0 lb

## 2010-07-17 DIAGNOSIS — J069 Acute upper respiratory infection, unspecified: Secondary | ICD-10-CM | POA: Insufficient documentation

## 2010-07-17 DIAGNOSIS — Z7189 Other specified counseling: Secondary | ICD-10-CM | POA: Insufficient documentation

## 2010-07-17 DIAGNOSIS — Z124 Encounter for screening for malignant neoplasm of cervix: Secondary | ICD-10-CM

## 2010-07-17 DIAGNOSIS — Z01419 Encounter for gynecological examination (general) (routine) without abnormal findings: Secondary | ICD-10-CM | POA: Insufficient documentation

## 2010-07-17 DIAGNOSIS — Z23 Encounter for immunization: Secondary | ICD-10-CM

## 2010-07-17 DIAGNOSIS — J029 Acute pharyngitis, unspecified: Secondary | ICD-10-CM

## 2010-07-17 DIAGNOSIS — Z3009 Encounter for other general counseling and advice on contraception: Secondary | ICD-10-CM

## 2010-07-17 MED ORDER — NORGESTIMATE-ETH ESTRADIOL 0.25-35 MG-MCG PO TABS: 1.0000 | ORAL_TABLET | Freq: Every day | ORAL | Status: DC

## 2010-07-17 NOTE — Assessment & Plan Note (Signed)
Treat symptoms, gave red flags for return.  See pt instructions.

## 2010-07-17 NOTE — Progress Notes (Signed)
  Subjective:    Patient ID: Robin Wagner, female    DOB: [birth year], 30 y.o.   MRN: [id number]  HPI Cold symptoms: Pt reports since Thursday, back and neck ache, body aches, + cough, + rhinnorhea, +sore throat (now improving), + sick contact  Pap smear: States last pap 2 years ago, has never had abormal pap  Updated pmh, sh, fh, surg hx- see under appropriate tabs  Review of Systems    no fever. No decrease in appetite, no bowel or bladder problems. Objective:   Physical Exam  Constitutional: She is oriented to person, place, and time. She appears well-developed and well-nourished.  Cardiovascular: Normal rate, regular rhythm and normal heart sounds.  Exam reveals no gallop and no friction rub.   No murmur heard. Pulmonary/Chest: Effort normal and breath sounds normal. No respiratory distress. She has no wheezes.  Abdominal: Soft. She exhibits no distension and no mass. There is no tenderness. There is no rebound and no guarding.  Genitourinary: Vagina normal and uterus normal. There is no rash, tenderness, lesion or injury on the right labia. There is no rash, tenderness, lesion or injury on the left labia. Uterus is not enlarged and not tender. Cervix exhibits no motion tenderness and no discharge. Right adnexum displays no mass, no tenderness and no fullness. Left adnexum displays no mass, no tenderness and no fullness. No erythema or tenderness around the vagina. No signs of injury around the vagina. No vaginal discharge found.  Musculoskeletal: Normal range of motion. She exhibits no edema.  Neurological: She is alert and oriented to person, place, and time.  Skin: Skin is warm and dry. No rash noted.  Psychiatric: She has a normal mood and affect. Her behavior is normal. Judgment and thought content normal.       Pap smear obtained and sent to lab   Assessment & Plan:

## 2010-07-17 NOTE — Assessment & Plan Note (Signed)
Pt received tetanus today.  Pt up to date on health maintenance recommendations.

## 2010-07-17 NOTE — Assessment & Plan Note (Signed)
Pt to continue on sprintec, RX refill sent to pharmacy

## 2010-07-17 NOTE — Patient Instructions (Signed)
Take advil 800mg  every 8 hours as needed for headache and body aches. Afrin nasal spray- only use 3 days Honey as needed for cough Continue to drink the hot tea.  Return if any fever or any new or worsening symptoms or if not improving within 2 weeks.

## 2010-07-17 NOTE — Progress Notes (Signed)
  Subjective:    Patient ID: Robin Wagner, female    DOB: [birth year], 30 y.o.   MRN: [id number]  HPI Cold symptoms: Fatigue, body aches, imrpoved with advil, dizziness,  + productive cough (green mucous), + h/a back of head and behind eyes,  + sorethroat- now improving.  Symptoms since Thursday.  Muscles in ribs sore from coughing. + nausea.  Able to eat and drink. No fever.  No diarrhea.  No vomiting. + sick contacts.   Pap smear:   Review of Systems     Objective:   Physical Exam        Assessment & Plan:

## 2010-07-17 NOTE — Assessment & Plan Note (Signed)
Pap smear sent to lab

## 2010-07-18 ENCOUNTER — Encounter: Payer: Self-pay | Admitting: Family Medicine

## 2010-11-10 LAB — URINALYSIS, ROUTINE W REFLEX MICROSCOPIC
Bilirubin Urine: NEGATIVE
Glucose, UA: NEGATIVE mg/dL
Specific Gravity, Urine: 1.02 (ref 1.005–1.030)

## 2010-11-10 LAB — ABO/RH: ABO/RH(D): B POS

## 2010-11-10 LAB — URINE MICROSCOPIC-ADD ON

## 2010-11-10 LAB — HCG, QUANTITATIVE, PREGNANCY: hCG, Beta Chain, Quant, S: 152003 m[IU]/mL — ABNORMAL HIGH (ref <5)

## 2010-11-10 LAB — CBC
Platelets: 278 10*3/uL (ref 150–400)
RDW: 13 % (ref 11.5–15.5)
WBC: 9.4 10*3/uL (ref 4.0–10.5)

## 2010-11-10 LAB — WET PREP, GENITAL
Trich, Wet Prep: NONE SEEN
Yeast Wet Prep HPF POC: NONE SEEN

## 2010-11-10 LAB — POCT PREGNANCY, URINE: Preg Test, Ur: POSITIVE

## 2010-11-10 LAB — GC/CHLAMYDIA PROBE AMP, GENITAL: GC Probe Amp, Genital: NEGATIVE

## 2011-04-04 ENCOUNTER — Ambulatory Visit (INDEPENDENT_AMBULATORY_CARE_PROVIDER_SITE_OTHER): Payer: Self-pay | Admitting: Family Medicine

## 2011-04-04 VITALS — BP 117/78 | HR 96 | Temp 99.5°F | Ht 65.0 in | Wt 122.0 lb

## 2011-04-04 DIAGNOSIS — J029 Acute pharyngitis, unspecified: Secondary | ICD-10-CM

## 2011-04-04 DIAGNOSIS — J069 Acute upper respiratory infection, unspecified: Secondary | ICD-10-CM

## 2011-04-04 NOTE — Progress Notes (Signed)
  Subjective:    Patient ID: Robin Wagner, female    DOB: October 10, 1980, 31 y.o.   MRN: [id number]  HPI  cold symptoms: Since yesterday. Positive hoarseness, positive sore throat, itching in ears, positive headache off and on, body aches all over, positive nausea. Decrease in appetite. Also decrease her fluid intake to secondary to sore throat. Positive nasal congestion and runny nose. No vomiting. No fever. No diarrhea. No abdominal pain. No wheezing. No cough. Positive sick contact at home-children.   Review of Systems As per above.    Objective:   Physical Exam  Constitutional: She appears well-developed and well-nourished.  HENT:  Head: Normocephalic and atraumatic.       Positive hoarseness. Positive throat erythema. No exudate. Positive nasal discharge-clear exudate  Eyes: Pupils are equal, round, and reactive to light. Right eye exhibits no discharge. Left eye exhibits no discharge.  Neck: Normal range of motion. No thyromegaly present.  Cardiovascular: Normal rate, regular rhythm and normal heart sounds.   No murmur heard. Pulmonary/Chest: Effort normal. No respiratory distress. She has no wheezes. She has no rales. She exhibits no tenderness.  Abdominal: Soft. She exhibits no distension.  Musculoskeletal: She exhibits no edema.  Lymphadenopathy:    She has no cervical adenopathy.  Neurological: She is alert.  Skin: No rash noted.  Psychiatric: She has a normal mood and affect.          Assessment & Plan:  Of thousand has

## 2011-04-04 NOTE — Patient Instructions (Signed)
Sorethroat: Salt water gargle Tylenol and motrin as needed Chloraseptic spray.   Body aches: Tylenol and/or motrin  Nasal symptoms: Nasal saline spray Afrin for  3 days only.   Return as needed.

## 2011-04-12 NOTE — Assessment & Plan Note (Signed)
Symptoms most likely 2/2 viral etiology. Strep test negative. Symptomatic treatment only at this time.  Return as needed for new or worsening of symptoms.

## 2011-10-16 ENCOUNTER — Telehealth: Payer: Self-pay | Admitting: Family Medicine

## 2011-10-16 DIAGNOSIS — Z309 Encounter for contraceptive management, unspecified: Secondary | ICD-10-CM

## 2011-10-16 MED ORDER — NORGESTIMATE-ETH ESTRADIOL 0.25-35 MG-MCG PO TABS: 1.0000 | ORAL_TABLET | Freq: Every day | ORAL | Status: DC

## 2011-10-16 NOTE — Telephone Encounter (Signed)
Rx with 1 refill sent. Please call and let the patient know that she needs to be seen by me as her new provider prior to any other refills. Thank you.

## 2011-10-16 NOTE — Telephone Encounter (Signed)
Forward to PCP for refill

## 2011-10-16 NOTE — Telephone Encounter (Signed)
Need to have refill on birth control med sent to [facility] on [address].

## 2012-04-14 ENCOUNTER — Other Ambulatory Visit: Payer: Self-pay | Admitting: *Deleted

## 2012-04-14 DIAGNOSIS — Z309 Encounter for contraceptive management, unspecified: Secondary | ICD-10-CM

## 2012-04-14 MED ORDER — NORGESTIMATE-ETH ESTRADIOL 0.25-35 MG-MCG PO TABS: 1.0000 | ORAL_TABLET | Freq: Every day | ORAL | Status: DC

## 2012-04-14 NOTE — Telephone Encounter (Signed)
No refills until patient seen in clinic.

## 2012-05-29 ENCOUNTER — Other Ambulatory Visit: Payer: Self-pay | Admitting: *Deleted

## 2012-05-29 DIAGNOSIS — Z309 Encounter for contraceptive management, unspecified: Secondary | ICD-10-CM

## 2012-05-29 MED ORDER — NORGESTIMATE-ETH ESTRADIOL 0.25-35 MG-MCG PO TABS: 1.0000 | ORAL_TABLET | Freq: Every day | ORAL | Status: DC

## 2012-06-17 ENCOUNTER — Encounter: Payer: Self-pay | Admitting: Family Medicine

## 2012-09-12 ENCOUNTER — Ambulatory Visit (INDEPENDENT_AMBULATORY_CARE_PROVIDER_SITE_OTHER): Payer: No Typology Code available for payment source | Admitting: Family Medicine

## 2012-09-12 ENCOUNTER — Encounter: Payer: Self-pay | Admitting: Family Medicine

## 2012-09-12 VITALS — BP 112/77 | HR 80 | Ht 65.0 in | Wt 123.0 lb

## 2012-09-12 DIAGNOSIS — Z309 Encounter for contraceptive management, unspecified: Secondary | ICD-10-CM

## 2012-09-12 DIAGNOSIS — R200 Anesthesia of skin: Secondary | ICD-10-CM | POA: Insufficient documentation

## 2012-09-12 DIAGNOSIS — Z3009 Encounter for other general counseling and advice on contraception: Secondary | ICD-10-CM

## 2012-09-12 DIAGNOSIS — R209 Unspecified disturbances of skin sensation: Secondary | ICD-10-CM

## 2012-09-12 DIAGNOSIS — L659 Nonscarring hair loss, unspecified: Secondary | ICD-10-CM | POA: Insufficient documentation

## 2012-09-12 MED ORDER — NORGESTIMATE-ETH ESTRADIOL 0.25-35 MG-MCG PO TABS: 1.0000 | ORAL_TABLET | Freq: Every day | ORAL | Status: DC

## 2012-09-12 NOTE — Assessment & Plan Note (Signed)
Continued OCPs, discussed possible vasectomy or nexplanon in the future.

## 2012-09-12 NOTE — Assessment & Plan Note (Signed)
Likely just age-related changes vs. possible OCP side effect. Monitor, more aggressive work-up if becomes patchy or more severe. - TSH, BMET

## 2012-09-12 NOTE — Assessment & Plan Note (Addendum)
Short duration, likely MSK/thoracic outlet restriction. Monitor.

## 2012-09-12 NOTE — Progress Notes (Signed)
  Subjective:    Patient ID: Robin Wagner, female    DOB: Feb 04, 1981, 32 y.o.   MRN: [id number]  HPI Patient complains of hair loss, progressive over the past few years. Diffuse, no patches, hair still looks fine but is thinner than it used to be by her report. No scalp pain itching or bald spots. Denies weight changes, skin changes, heat/cold intolerance.  Patient complains of left arm heaviness/numbness since yesterday. Corresponded to vigorous shoulder massage. No chest or jaw pain/pressure.  Review of Systems  Constitutional: Negative.  Negative for activity change, appetite change and fatigue.  HENT: Positive for neck pain. Negative for neck stiffness.   Eyes: Negative.   Respiratory: Negative.   Cardiovascular: Negative.   Gastrointestinal: Negative.   Endocrine: Negative.  Negative for cold intolerance and heat intolerance.  Musculoskeletal: Positive for arthralgias.  Skin: Negative.        Objective:   Physical Exam  Constitutional: She is oriented to person, place, and time. She appears well-developed and well-nourished. No distress.  HENT:  Head: Normocephalic and atraumatic.  Right Ear: External ear normal.  Left Ear: External ear normal.  Nose: Nose normal.  Hair and scalp normal without patches of alopecia or other findings. Subjectively thinner hair per patient.  Eyes: Conjunctivae are normal. Right eye exhibits no discharge. Left eye exhibits no discharge. No scleral icterus.  Neck: Normal range of motion. Neck supple. No thyromegaly present.  Cardiovascular: Normal rate, regular rhythm, normal heart sounds and intact distal pulses.   No murmur heard. Pulmonary/Chest: Effort normal and breath sounds normal. No respiratory distress.  Musculoskeletal: Normal range of motion. She exhibits no edema and no tenderness.  Lymphadenopathy:    She has no cervical adenopathy.  Neurological: She is alert and oriented to person, place, and time.  Skin: Skin is warm and dry.  No rash noted. She is not diaphoretic. No erythema. No pallor.  Psychiatric: She has a normal mood and affect. Her behavior is normal. Judgment and thought content normal.          Assessment & Plan:

## 2012-09-12 NOTE — Patient Instructions (Addendum)
Today we talked about your arm numbness, hair loss and birth control options.   For the hair loss we will do some blood work today. If anything is abnormal we will call you, otherwise you will get a letter.  I do not think the arm numbness is anything worrisome as your heart sounds fine and it is clearly getting good blood flow. If it continues to be an issue or gets worse we can explore it further.  For birth control, I think your best option is to continue birth control pills unless you can convince your husband to get a vasectomy.  It was nice meeting you today. Thank you,  Dr. Richarda Blade

## 2012-09-13 LAB — BASIC METABOLIC PANEL
CO2: 28 mEq/L (ref 19–32)
Calcium: 9.5 mg/dL (ref 8.4–10.5)
Glucose, Bld: 76 mg/dL (ref 70–99)
Potassium: 4.3 mEq/L (ref 3.5–5.3)
Sodium: 139 mEq/L (ref 135–145)

## 2012-09-13 LAB — TSH: TSH: 1.449 u[IU]/mL (ref 0.350–4.500)

## 2012-09-19 ENCOUNTER — Encounter: Payer: No Typology Code available for payment source | Admitting: Family Medicine

## 2012-09-22 ENCOUNTER — Encounter: Payer: Self-pay | Admitting: Family Medicine

## 2012-09-29 ENCOUNTER — Telehealth: Payer: Self-pay | Admitting: Family Medicine

## 2012-09-29 NOTE — Telephone Encounter (Signed)
The patient is concerned about all of the hair loss that she believes is coming from the birth control and would like to discuss the cost of tubal ligation.

## 2012-09-29 NOTE — Telephone Encounter (Signed)
Advised pt to contact OB/GYN [facility] of a tubal. Pt voiced understanding.

## 2013-08-27 ENCOUNTER — Other Ambulatory Visit: Payer: Self-pay | Admitting: *Deleted

## 2013-08-28 MED ORDER — NORGESTIMATE-ETH ESTRADIOL 0.25-35 MG-MCG PO TABS: 1.0000 | ORAL_TABLET | Freq: Every day | ORAL | Status: DC

## 2014-09-26 ENCOUNTER — Other Ambulatory Visit: Payer: Self-pay | Admitting: Family Medicine

## 2014-09-27 ENCOUNTER — Other Ambulatory Visit: Payer: Self-pay | Admitting: Family Medicine

## 2014-09-27 DIAGNOSIS — Z3041 Encounter for surveillance of contraceptive pills: Secondary | ICD-10-CM

## 2014-09-27 MED ORDER — NORGESTIMATE-ETH ESTRADIOL 0.25-35 MG-MCG PO TABS: 1.0000 | ORAL_TABLET | Freq: Every day | ORAL | Status: DC

## 2014-09-27 NOTE — Telephone Encounter (Signed)
Pt needs her birth control refilled. She scheduled an appt to see PCP by end of September. Would like to know if she can get covered until then. Robin Wagner, ASA

## 2014-11-02 ENCOUNTER — Other Ambulatory Visit (HOSPITAL_COMMUNITY)
Admission: RE | Admit: 2014-11-02 | Discharge: 2014-11-02 | Disposition: A | Discharge status: Home / Self Care | Payer: Self-pay | Source: Ambulatory Visit | Attending: Family Medicine | Admitting: Family Medicine

## 2014-11-02 ENCOUNTER — Encounter: Payer: Self-pay | Admitting: Family Medicine

## 2014-11-02 ENCOUNTER — Ambulatory Visit (INDEPENDENT_AMBULATORY_CARE_PROVIDER_SITE_OTHER): Payer: Self-pay | Admitting: Family Medicine

## 2014-11-02 VITALS — BP 112/64 | HR 67 | Temp 98.3°F | Ht 65.0 in | Wt 122.0 lb

## 2014-11-02 DIAGNOSIS — Z01419 Encounter for gynecological examination (general) (routine) without abnormal findings: Secondary | ICD-10-CM | POA: Insufficient documentation

## 2014-11-02 DIAGNOSIS — Z124 Encounter for screening for malignant neoplasm of cervix: Secondary | ICD-10-CM

## 2014-11-02 DIAGNOSIS — Z1151 Encounter for screening for human papillomavirus (HPV): Secondary | ICD-10-CM | POA: Insufficient documentation

## 2014-11-02 DIAGNOSIS — N898 Other specified noninflammatory disorders of vagina: Secondary | ICD-10-CM

## 2014-11-02 DIAGNOSIS — Z309 Encounter for contraceptive management, unspecified: Secondary | ICD-10-CM

## 2014-11-02 DIAGNOSIS — Z3009 Encounter for other general counseling and advice on contraception: Secondary | ICD-10-CM

## 2014-11-02 LAB — POCT WET PREP (WET MOUNT): CLUE CELLS WET PREP WHIFF POC: NEGATIVE

## 2014-11-02 NOTE — Patient Instructions (Signed)

## 2014-11-03 DIAGNOSIS — N898 Other specified noninflammatory disorders of vagina: Secondary | ICD-10-CM | POA: Insufficient documentation

## 2014-11-03 DIAGNOSIS — Z01419 Encounter for gynecological examination (general) (routine) without abnormal findings: Secondary | ICD-10-CM | POA: Insufficient documentation

## 2014-11-03 NOTE — Progress Notes (Signed)
   Subjective:   Robin Wagner is a 34 y.o. female without a history here for well woman exam  Pt reports doing very well. 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 Wants to discuss permanent birth control options and vaginal discharge. She reports that about once a month she gets thick white discharge that is odorless and associated with vaginal itching. No odor, abdominal pain, dysuria. No concern for STIs.  Review of Systems:  Per HPI. All other systems reviewed and are negative.   PMH, PSH, Medications, Allergies, and FmHx reviewed and updated in EMR.  Social History: never smoker, never drinker, no drugs, married with 3 kids  Objective:  BP 112/64 mmHg  Pulse 67  Temp(Src) 98.3 F (36.8 C) (Oral)  Ht  (1.651 m)  Wt 122 lb (55.339 kg)  BMI 20.30 kg/m2  LMP 10/21/2014  Gen:  33 y.o. female in NAD HEENT: NCAT, MMM, EOMI, PERRL, anicteric sclerae CV: RRR, no MRG, no JVD Resp: Non-labored, CTAB, no wheezes noted Abd: Soft, NTND, BS present, no guarding or organomegaly Ext: WWP, no edema MSK: Full ROM, strength intact Neuro: Alert and oriented, speech normal      Chemistry      Component Value Date/Time   NA 139 09/12/2012 1614   K 4.3 09/12/2012 1614   CL 105 09/12/2012 1614   CO2 28 09/12/2012 1614   BUN 14 09/12/2012 1614   CREATININE 0.60 09/12/2012 1614      Component Value Date/Time   CALCIUM 9.5 09/12/2012 1614      Lab Results  Component Value Date   WBC 9.9 08/27/2008   HGB 11.4* 08/27/2008   HCT 32.0* 08/27/2008   MCV 91.9 08/27/2008   PLT 112 DELTA CHECK NOTED* 08/27/2008   Lab Results  Component Value Date   TSH 1.449 09/12/2012   No results found for: HGBA1C Assessment & Plan:     Robin Wagner is a 34 y.o. female here for well woman exam Pap smear done today. Patient to return for flu shot with children.  Birth control counseling Currently stable on  combined OCPs but asks about permanent options, husband agreeable to vasectomy - counseled patient on superior safety combared to tubal ligation - gave [facility] info, husband to call and contact us for referral if needed (unlikely with private insurance) - patient to continue current OCPs until confirmation of successful vasectomy  Vaginal discharge Monthly think white discharge and itching, no odor - negative wet prep - likely soap or detergent causing irritation vs normal hormonal fluctuations    Beverely Low, MD, MPH [facility] PGY-3 11/03/2014 11:52 AM

## 2014-11-03 NOTE — Assessment & Plan Note (Signed)
Currently stable on combined OCPs but asks about permanent options, husband agreeable to vasectomy - counseled patient on superior safety combared to tubal ligation - gave [facility] info, husband to call and contact us for referral if needed (unlikely with private insurance) - patient to continue current OCPs until confirmation of successful vasectomy

## 2014-11-03 NOTE — Assessment & Plan Note (Signed)
Monthly think white discharge and itching, no odor - negative wet prep - likely soap or detergent causing irritation vs normal hormonal fluctuations

## 2014-11-04 LAB — CYTOLOGY - PAP

## 2014-11-05 ENCOUNTER — Encounter: Payer: Self-pay | Admitting: *Deleted

## 2015-10-05 DIAGNOSIS — J019 Acute sinusitis, unspecified: Secondary | ICD-10-CM | POA: Diagnosis not present

## 2015-10-05 DIAGNOSIS — T170XXD Foreign body in nasal sinus, subsequent encounter: Secondary | ICD-10-CM | POA: Diagnosis not present

## 2015-10-05 DIAGNOSIS — Z3009 Encounter for other general counseling and advice on contraception: Secondary | ICD-10-CM | POA: Diagnosis not present

## 2015-10-20 ENCOUNTER — Other Ambulatory Visit: Payer: Self-pay | Admitting: Obstetrics and Gynecology

## 2015-10-28 ENCOUNTER — Encounter (HOSPITAL_COMMUNITY)
Admission: RE | Admit: 2015-10-28 | Discharge: 2015-10-28 | Disposition: A | Discharge status: Home / Self Care | Payer: BLUE CROSS/BLUE SHIELD | Source: Ambulatory Visit | Attending: Obstetrics and Gynecology | Admitting: Obstetrics and Gynecology

## 2015-10-28 ENCOUNTER — Encounter (HOSPITAL_COMMUNITY): Payer: Self-pay

## 2015-10-28 DIAGNOSIS — Z01812 Encounter for preprocedural laboratory examination: Secondary | ICD-10-CM | POA: Diagnosis not present

## 2015-10-28 DIAGNOSIS — Z8249 Family history of ischemic heart disease and other diseases of the circulatory system: Secondary | ICD-10-CM | POA: Diagnosis not present

## 2015-10-28 HISTORY — DX: Other specified health status: Z78.9

## 2015-10-28 LAB — CBC
HEMATOCRIT: 37.3 % (ref 36.0–46.0)
HEMOGLOBIN: 12.8 g/dL (ref 12.0–15.0)
MCH: 29.8 pg (ref 26.0–34.0)
MCHC: 34.3 g/dL (ref 30.0–36.0)
MCV: 86.9 fL (ref 78.0–100.0)
Platelets: 265 10*3/uL (ref 150–400)
RBC: 4.29 MIL/uL (ref 3.87–5.11)
RDW: 13.2 % (ref 11.5–15.5)
WBC: 8.4 10*3/uL (ref 4.0–10.5)

## 2015-10-28 NOTE — Patient Instructions (Addendum)
Your procedure is scheduled on:11/10/15  Enter through the Main Entrance at :1130 am Pick up desk phone and dial [phone] and inform us of your arrival.  Please call [phone] if you have any problems the morning of surgery.  Remember: Do not eat food after midnight:WED Clear liquids are ok until:9am on Thursday   You may brush your teeth the morning of surgery.    DO NOT wear jewelry, eye make-up, lipstick,body lotion, or dark fingernail polish.  (Polished toes are ok) You may wear deodorant.   Patients discharged on the day of surgery will not be allowed to drive home. Wear loose fitting, comfortable clothes for your ride home.

## 2015-11-10 ENCOUNTER — Encounter (HOSPITAL_COMMUNITY)
Admission: RE | Disposition: A | Discharge status: Home / Self Care | Payer: Self-pay | Source: Ambulatory Visit | Attending: Obstetrics and Gynecology

## 2015-11-10 ENCOUNTER — Encounter (HOSPITAL_COMMUNITY): Payer: Self-pay | Admitting: *Deleted

## 2015-11-10 ENCOUNTER — Ambulatory Visit (HOSPITAL_COMMUNITY): Payer: BLUE CROSS/BLUE SHIELD | Admitting: Anesthesiology

## 2015-11-10 ENCOUNTER — Ambulatory Visit (HOSPITAL_COMMUNITY)
Admission: RE | Admit: 2015-11-10 | Discharge: 2015-11-10 | Disposition: A | Discharge status: Home / Self Care | Payer: BLUE CROSS/BLUE SHIELD | Source: Ambulatory Visit | Attending: Obstetrics and Gynecology | Admitting: Obstetrics and Gynecology

## 2015-11-10 DIAGNOSIS — Z302 Encounter for sterilization: Secondary | ICD-10-CM | POA: Insufficient documentation

## 2015-11-10 HISTORY — PX: LAPAROSCOPIC TUBAL LIGATION: SHX1937

## 2015-11-10 LAB — PREGNANCY, URINE: PREG TEST UR: NEGATIVE

## 2015-11-10 SURGERY — LIGATION, FALLOPIAN TUBE, LAPAROSCOPIC
Anesthesia: General | Site: Abdomen | Laterality: Bilateral

## 2015-11-10 MED ORDER — FENTANYL CITRATE (PF) 100 MCG/2ML IJ SOLN
25.0000 ug | INTRAMUSCULAR | Status: DC | PRN
  Administered 2015-11-10: 25 ug via INTRAVENOUS

## 2015-11-10 MED ORDER — MIDAZOLAM HCL 2 MG/2ML IJ SOLN
INTRAMUSCULAR | Status: DC | PRN
  Administered 2015-11-10: 2 mg via INTRAVENOUS

## 2015-11-10 MED ORDER — BUPIVACAINE HCL (PF) 0.25 % IJ SOLN
INTRAMUSCULAR | Status: AC
  Filled 2015-11-10: qty 30

## 2015-11-10 MED ORDER — PROPOFOL 10 MG/ML IV BOLUS
INTRAVENOUS | Status: DC | PRN
  Administered 2015-11-10: 150 mg via INTRAVENOUS

## 2015-11-10 MED ORDER — GLYCOPYRROLATE 0.2 MG/ML IJ SOLN
INTRAMUSCULAR | Status: AC
  Filled 2015-11-10: qty 3

## 2015-11-10 MED ORDER — FENTANYL CITRATE (PF) 100 MCG/2ML IJ SOLN
INTRAMUSCULAR | Status: AC
  Filled 2015-11-10: qty 2

## 2015-11-10 MED ORDER — ROCURONIUM BROMIDE 100 MG/10ML IV SOLN
INTRAVENOUS | Status: DC | PRN
  Administered 2015-11-10: 30 mg via INTRAVENOUS

## 2015-11-10 MED ORDER — KETOROLAC TROMETHAMINE 30 MG/ML IJ SOLN
INTRAMUSCULAR | Status: AC
  Filled 2015-11-10: qty 1

## 2015-11-10 MED ORDER — PROMETHAZINE HCL 25 MG/ML IJ SOLN: 6.2500 mg | INTRAMUSCULAR | Status: DC | PRN

## 2015-11-10 MED ORDER — OXYCODONE-ACETAMINOPHEN 5-325 MG PO TABS
1.0000 | ORAL_TABLET | ORAL | Status: DC | PRN
Start: 2015-11-10 — End: 2015-11-10
  Administered 2015-11-10: 1 via ORAL

## 2015-11-10 MED ORDER — IBUPROFEN 800 MG PO TABS
800.0000 mg | ORAL_TABLET | Freq: Three times a day (TID) | ORAL | 0 refills | Status: AC | PRN
Start: 2015-11-10 — End: ?

## 2015-11-10 MED ORDER — LACTATED RINGERS IV SOLN
INTRAVENOUS | Status: DC
  Administered 2015-11-10 (×3): via INTRAVENOUS

## 2015-11-10 MED ORDER — OXYCODONE-ACETAMINOPHEN 5-325 MG PO TABS: 1.0000 | ORAL_TABLET | Freq: Four times a day (QID) | ORAL | 0 refills | Status: DC | PRN

## 2015-11-10 MED ORDER — PROPOFOL 10 MG/ML IV BOLUS
INTRAVENOUS | Status: AC
  Filled 2015-11-10: qty 20

## 2015-11-10 MED ORDER — MIDAZOLAM HCL 2 MG/2ML IJ SOLN
INTRAMUSCULAR | Status: AC
  Filled 2015-11-10: qty 2

## 2015-11-10 MED ORDER — NEOSTIGMINE METHYLSULFATE 10 MG/10ML IV SOLN
INTRAVENOUS | Status: AC
  Filled 2015-11-10: qty 1

## 2015-11-10 MED ORDER — KETOROLAC TROMETHAMINE 30 MG/ML IJ SOLN
INTRAMUSCULAR | Status: DC | PRN
  Administered 2015-11-10: 30 mg via INTRAMUSCULAR
  Administered 2015-11-10: 30 mg via INTRAVENOUS

## 2015-11-10 MED ORDER — SCOPOLAMINE 1 MG/3DAYS TD PT72
MEDICATED_PATCH | TRANSDERMAL | Status: AC
  Administered 2015-11-10: 1.5 mg via TRANSDERMAL
  Filled 2015-11-10: qty 1

## 2015-11-10 MED ORDER — ONDANSETRON HCL 4 MG/2ML IJ SOLN
INTRAMUSCULAR | Status: DC | PRN
  Administered 2015-11-10: 4 mg via INTRAVENOUS

## 2015-11-10 MED ORDER — LIDOCAINE HCL (CARDIAC) 20 MG/ML IV SOLN
INTRAVENOUS | Status: DC | PRN
  Administered 2015-11-10: 60 mg via INTRAVENOUS

## 2015-11-10 MED ORDER — DEXAMETHASONE SODIUM PHOSPHATE 4 MG/ML IJ SOLN
INTRAMUSCULAR | Status: AC
  Filled 2015-11-10: qty 1

## 2015-11-10 MED ORDER — LIDOCAINE HCL (CARDIAC) 20 MG/ML IV SOLN
INTRAVENOUS | Status: AC
  Filled 2015-11-10: qty 5

## 2015-11-10 MED ORDER — DEXAMETHASONE SODIUM PHOSPHATE 4 MG/ML IJ SOLN
INTRAMUSCULAR | Status: DC | PRN
  Administered 2015-11-10: 4 mg via INTRAVENOUS

## 2015-11-10 MED ORDER — SCOPOLAMINE 1 MG/3DAYS TD PT72
1.0000 | MEDICATED_PATCH | Freq: Once | TRANSDERMAL | Status: DC
  Administered 2015-11-10: 1.5 mg via TRANSDERMAL

## 2015-11-10 MED ORDER — FENTANYL CITRATE (PF) 100 MCG/2ML IJ SOLN
INTRAMUSCULAR | Status: DC | PRN
  Administered 2015-11-10 (×4): 50 ug via INTRAVENOUS

## 2015-11-10 MED ORDER — OXYCODONE-ACETAMINOPHEN 5-325 MG PO TABS
ORAL_TABLET | ORAL | Status: AC
  Filled 2015-11-10: qty 1

## 2015-11-10 MED ORDER — ONDANSETRON HCL 4 MG/2ML IJ SOLN
INTRAMUSCULAR | Status: AC
  Filled 2015-11-10: qty 2

## 2015-11-10 MED ORDER — ROCURONIUM BROMIDE 100 MG/10ML IV SOLN
INTRAVENOUS | Status: AC
  Filled 2015-11-10: qty 1

## 2015-11-10 MED ORDER — BUPIVACAINE HCL (PF) 0.25 % IJ SOLN
INTRAMUSCULAR | Status: DC | PRN
  Administered 2015-11-10: 5 mL

## 2015-11-10 SURGICAL SUPPLY — 22 items
CATH ROBINSON RED A/P 16FR (CATHETERS) ×3 IMPLANT
CLOTH BEACON ORANGE TIMEOUT ST (SAFETY) ×3 IMPLANT
DRSG OPSITE POSTOP 3X4 (GAUZE/BANDAGES/DRESSINGS) ×2 IMPLANT
DURAPREP 26ML APPLICATOR (WOUND CARE) ×3 IMPLANT
GLOVE BIOGEL PI IND STRL 7.0 (GLOVE) ×2 IMPLANT
GLOVE BIOGEL PI INDICATOR 7.0 (GLOVE) ×4
GLOVE ECLIPSE 6.5 STRL STRAW (GLOVE) ×3 IMPLANT
GOWN STRL REUS W/TWL LRG LVL3 (GOWN DISPOSABLE) ×6 IMPLANT
NEEDLE INSUFFLATION 120MM (ENDOMECHANICALS) ×3 IMPLANT
PACK LAPAROSCOPY BASIN (CUSTOM PROCEDURE TRAY) ×3 IMPLANT
PACK TRENDGUARD 450 HYBRID PRO (MISCELLANEOUS) IMPLANT
PACK TRENDGUARD 600 HYBRD PROC (MISCELLANEOUS) IMPLANT
PROTECTOR NERVE ULNAR (MISCELLANEOUS) ×4 IMPLANT
SLEEVE XCEL OPT CAN 5 100 (ENDOMECHANICALS) ×3 IMPLANT
SUT VICRYL 0 UR6 27IN ABS (SUTURE) ×3 IMPLANT
SUT VICRYL 4-0 PS2 18IN ABS (SUTURE) ×3 IMPLANT
TOWEL OR 17X24 6PK STRL BLUE (TOWEL DISPOSABLE) ×6 IMPLANT
TRENDGUARD 450 HYBRID PRO PACK (MISCELLANEOUS) ×3
TRENDGUARD 600 HYBRID PROC PK (MISCELLANEOUS)
TROCAR OPTI TIP 5M 100M (ENDOMECHANICALS) ×3 IMPLANT
TROCAR XCEL DIL TIP R 11M (ENDOMECHANICALS) ×3 IMPLANT
WATER STERILE IRR 1000ML POUR (IV SOLUTION) ×3 IMPLANT

## 2015-11-10 NOTE — Discharge Instructions (Signed)
CALL  IF TEMP>100.4, NOTHING PER VAGINA X 1 WK, CALL IF SOAKING A MAXI  PAD EVERY HOUR OR MORE FREQUENTLY Warm heat to abdomen every 4 hrs x 24 hrs   DISCHARGE INSTRUCTIONS: Laparoscopy  The following instructions have been prepared to help you care for yourself upon your return home today.  Wound care:  Do not get the incision wet for the first 24 hours. The incision should be kept clean and dry.  The Band-Aids or dressings may be removed the day after surgery.  Should the incision become sore, red, and swollen after the first week, check with your doctor.  Personal hygiene:  Shower the day after your procedure.  Activity and limitations:  Do NOT drive or operate any equipment today.  Do NOT lift anything more than 15 pounds for 2-3 weeks after surgery.  Do NOT rest in bed all day.  Walking is encouraged. Walk each day, starting slowly with 5-minute walks 3 or 4 times a day. Slowly increase the length of your walks.  Walk up and down stairs slowly.  Do NOT do strenuous activities, such as golfing, playing tennis, bowling, running, biking, weight lifting, gardening, mowing, or vacuuming for 2-4 weeks. Ask your doctor when it is okay to start.  Diet: Eat a light meal as desired this evening. You may resume your usual diet tomorrow.  Return to work: This is dependent on the type of work you do. For the most part you can return to a desk job within a week of surgery. If you are more active at work, please discuss this with your doctor.  What to expect after your surgery: You may have a slight burning sensation when you urinate on the first day. You may have a very small amount of blood in the urine. Expect to have a small amount of vaginal discharge/light bleeding for 1-2 weeks. It is not unusual to have abdominal soreness and bruising for up to 2 weeks. You may be tired and need more rest for about 1 week. You may experience shoulder pain for 24-72 hours. Lying flat in bed may  relieve it.  Call your doctor for any of the following:  Develop a fever of 100.4 or greater  Inability to urinate 6 hours after discharge from hospital  Severe pain not relieved by pain medications  Persistent of heavy bleeding at incision site  Redness or swelling around incision site after a week  Increasing nausea or vomiting  Patient Signature________________________________________ Nurse Signature_________________________________________    Post Anesthesia Home Care Instructions  Activity: Get plenty of rest for the remainder of the day. A responsible adult should stay with you for 24 hours following the procedure.  For the next 24 hours, DO NOT: -Drive a car -Advertising copywriter -Drink alcoholic beverages -Take any medication unless instructed by your physician -Make any legal decisions or sign important papers.  Meals: Start with liquid foods such as gelatin or soup. Progress to regular foods as tolerated. Avoid greasy, spicy, heavy foods. If nausea and/or vomiting occur, drink only clear liquids until the nausea and/or vomiting subsides. Call your physician if vomiting continues.  Special Instructions/Symptoms: Your throat may feel dry or sore from the anesthesia or the breathing tube placed in your throat during surgery. If this causes discomfort, gargle with warm salt water. The discomfort should disappear within 24 hours.  If you had a scopolamine patch placed behind your ear for the management of post- operative nausea and/or vomiting:  1. The medication in  the patch is effective for 72 hours, after which it should be removed.  Wrap patch in a tissue and discard in the trash. Wash hands thoroughly with soap and water. 2. You may remove the patch earlier than 72 hours if you experience unpleasant side effects which may include dry mouth, dizziness or visual disturbances. 3. Avoid touching the patch. Wash your hands with soap and water after contact with the  patch.

## 2015-11-10 NOTE — Transfer of Care (Signed)
Immediate Anesthesia Transfer of Care Note  Patient: Karisha Schaber  Procedure(s) Performed: Procedure(s) with comments: LAPAROSCOPIC TUBAL LIGATION With Bipolar Cautery (Bilateral) - 1 hr. DUE TO PATIENT'S RELIGION, SHE IS REQUESTING FEMALE ANESTHESIOLOGIST. ALSO FEMALE NURSING STAFF IF AT ALL POSSIBLE.  Patient Location: PACU  Anesthesia Type:General  Level of Consciousness: awake, alert , oriented and patient cooperative  Airway & Oxygen Therapy: Patient Spontanous Breathing and Patient connected to nasal cannula oxygen  Post-op Assessment: Report given to RN and Post -op Vital signs reviewed and stable  Post vital signs: Reviewed and stable  Last Vitals:  TEMP 98.2 BP 108/55 HR 82 RR 20 POX 100  Last Pain: 0     Patients Stated Pain Goal: 3 (11/10/15 1236)  Complications: No apparent anesthesia complications

## 2015-11-10 NOTE — Anesthesia Procedure Notes (Signed)
Procedure Name: Intubation Date/Time: 11/10/2015 1:22 PM Performed by: Collier FlowersSPEIGHT, Brailen Macneal J Pre-anesthesia Checklist: Patient identified, Emergency Drugs available, Suction available, Timeout performed and Patient being monitored Patient Re-evaluated:Patient Re-evaluated prior to inductionOxygen Delivery Method: Circle system utilized Preoxygenation: Pre-oxygenation with 100% oxygen Intubation Type: IV induction Ventilation: Mask ventilation without difficulty Laryngoscope Size: Mac and 3 Grade View: Grade I Tube type: Oral Tube size: 7.0 mm Number of attempts: 1 Airway Equipment and Method: Stylet Placement Confirmation: ETT inserted through vocal cords under direct vision,  positive ETCO2 and breath sounds checked- equal and bilateral Secured at: 21 (At lips) cm Tube secured with: Tape Dental Injury: Teeth and Oropharynx as per pre-operative assessment

## 2015-11-10 NOTE — Anesthesia Preprocedure Evaluation (Addendum)
Anesthesia Evaluation  Patient identified by MRN, date of birth, ID band Patient awake    Reviewed: Allergy & Precautions, NPO status , Patient's Chart, lab work & pertinent test results  History of Anesthesia Complications Negative for: history of anesthetic complications  Airway Mallampati: II  TM Distance: >3 FB Neck ROM: Full    Dental  (+) Teeth Intact, Dental Advisory Given   Pulmonary neg pulmonary ROS,    Pulmonary exam normal breath sounds clear to auscultation       Cardiovascular negative cardio ROS   Rhythm:Regular Rate:Normal     Neuro/Psych negative neurological ROS     GI/Hepatic negative GI ROS, Neg liver ROS,   Endo/Other  negative endocrine ROS  Renal/GU negative Renal ROS     Musculoskeletal   Abdominal   Peds  Hematology negative hematology ROS (+)   Anesthesia Other Findings alopecia  Reproductive/Obstetrics                            Anesthesia Physical Anesthesia Plan  ASA: I  Anesthesia Plan: General   Post-op Pain Management:    Induction: Intravenous  Airway Management Planned: Oral ETT  Additional Equipment:   Intra-op Plan:   Post-operative Plan: Extubation in OR  Informed Consent: I have reviewed the patients History and Physical, chart, labs and discussed the procedure including the risks, benefits and alternatives for the proposed anesthesia with the patient or authorized representative who has indicated his/her understanding and acceptance.   Dental advisory given  Plan Discussed with: CRNA  Anesthesia Plan Comments: (Risks of general anesthesia discussed including, but not limited to, sore throat, hoarse voice, chipped/damaged teeth, injury to vocal cords, nausea and vomiting, allergic reactions, lung infection, heart attack, stroke, and death. All questions answered. )       Anesthesia Quick Evaluation

## 2015-11-10 NOTE — H&P (Signed)
Robin Wagner is an 35 y.o. female. G2P2 [place] female here for permanent sterilization. Pt is on OCP  Pertinent Gynecological History: Menses: regular Bleeding: nl Contraception: OCP (estrogen/progesterone) DES exposure: denies Blood transfusions: none Sexually transmitted diseases: no past history Previous GYN Procedures: none  Last mammogram: n/a Date: n/a Last pap: normal Date: 8/30 OB History: G2, P2  Menstrual History: Menarche age: n/a Patient's last menstrual period was 10/11/2015 (approximate).    Past Medical History:  Diagnosis Date  . Medical history non-contributory     Past Surgical History:  Procedure Laterality Date  . HAND SURGERY Right   . TOOTH EXTRACTION      Family History  Problem Relation Age of Onset  . Heart disease Mother     Social History:  reports that she has never smoked. She has never used smokeless tobacco. She reports that she does not drink alcohol or use drugs.  Allergies: No Known Allergies  Prescriptions Prior to Admission  Medication Sig Dispense Refill Last Dose  . Hyprom-Naphaz-Polysorb-Zn Sulf (CLEAR EYES COMPLETE OP) Apply 1 drop to eye daily as needed (FOR DRYNESS / REDNESS).   Past Week at Unknown time  . ibuprofen (ADVIL,MOTRIN) 200 MG tablet Take 800 mg by mouth every 6 (six) hours as needed for headache or mild pain.   Past Week at Unknown time    Review of Systems  All other systems reviewed and are negative.   Blood pressure 106/66, pulse 73, temperature 98.2 F (36.8 C), temperature source Oral, resp. rate 18, last menstrual period 10/11/2015, SpO2 100 %. Physical Exam  Constitutional: She is oriented to person, place, and time. She appears well-developed and well-nourished.  HENT:  Head: Atraumatic.  Eyes: EOM are normal.  Neck: Neck supple.  Cardiovascular: Normal rate.   Respiratory: Breath sounds normal.  Genitourinary: Vagina normal and uterus normal.  Musculoskeletal: Normal range of motion.   Neurological: She is alert and oriented to person, place, and time.  Skin: Skin is warm and dry.  Psychiatric: She has a normal mood and affect.  vulva nl  Cervix parous Adnexa no palp mass  No results found for this or any previous visit (from the past 24 hour(s)).  CBC    Component Value Date/Time   WBC 8.4 10/28/2015 1600   RBC 4.29 10/28/2015 1600   HGB 12.8 10/28/2015 1600   HCT 37.3 10/28/2015 1600   PLT 265 10/28/2015 1600   MCV 86.9 10/28/2015 1600   MCH 29.8 10/28/2015 1600   MCHC 34.3 10/28/2015 1600   RDW 13.2 10/28/2015 1600     Assessment/Plan: Encounter for sterilization P) LTL w/ cautery. Risk of surgery reviewed including permanence, nonreversible, failure rate 1/500-1/600, infection, bleeding, injury to underlying organ structure  Dominque Levandowski A 11/10/2015, 12:54 PM

## 2015-11-10 NOTE — Anesthesia Postprocedure Evaluation (Signed)
Anesthesia Post Note  Patient: Robin Wagner  Procedure(s) Performed: Procedure(s) (LRB): LAPAROSCOPIC TUBAL LIGATION With Bipolar Cautery (Bilateral)  Patient location during evaluation: PACU Anesthesia Type: General Level of consciousness: awake and alert Pain management: pain level controlled Vital Signs Assessment: post-procedure vital signs reviewed and stable Respiratory status: spontaneous breathing, nonlabored ventilation and respiratory function stable Cardiovascular status: blood pressure returned to baseline and stable Postop Assessment: no signs of nausea or vomiting Anesthetic complications: no     Last Vitals:  Vitals:   11/10/15 1545 11/10/15 1553  BP: (!) 95/59   Pulse: 72 83  Resp: 16 19  Temp:  36.9 C    Last Pain:  Vitals:   11/10/15 1553  TempSrc:   PainSc: 2    Pain Goal: Patients Stated Pain Goal: 3 (11/10/15 1423)               Linton RumpJennifer Dickerson Fayrene Towner

## 2015-11-10 NOTE — Brief Op Note (Signed)
11/10/2015  2:23 PM  PATIENT:  Robin Wagner  35 y.o. female  PRE-OPERATIVE DIAGNOSIS:  Desires Sterilization  POST-OPERATIVE DIAGNOSIS:  Desires Sterilization  PROCEDURE:  Laparoscopic tubal ligation with bipolar cautery  SURGEON:  Surgeon(s) and Role:    * Maxie BetterSheronette Draylon Mercadel, MD - Primary  PHYSICIAN ASSISTANT:   ASSISTANTS: none   ANESTHESIA:   general Findings: poss retrocecal appendix, nl tubes, ovaries, nl liver edge. No evidence of pelvic endometriosis, nl uterus EBL:  Total I/O In: -  Out: 18 [Urine:15; Blood:3]  BLOOD ADMINISTERED:none  DRAINS: none   LOCAL MEDICATIONS USED:  MARCAINE     SPECIMEN:  No Specimen  DISPOSITION OF SPECIMEN:  N/A  COUNTS:  YES  TOURNIQUET:  * No tourniquets in log *  DICTATION: .Other Dictation: Dictation Number [id number]  PLAN OF CARE: Discharge to home after PACU  PATIENT DISPOSITION:  PACU - hemodynamically stable.   Delay start of Pharmacological VTE agent (>24hrs) due to surgical blood loss or risk of bleeding: no

## 2015-11-11 NOTE — Op Note (Signed)
[name], Robin Wagner              ACCOUNT NO.:  [id number]  MEDICAL RECORD NO.:  [id number]  LOCATION:                                FACILITY:  [facility]  PHYSICIAN:  Maxie BetterSheronette Maxyne Wagner, Robin WagnerDATE OF BIRTH:  28-Dec-1980  DATE OF PROCEDURE:  11/10/2015 DATE OF DISCHARGE:                              OPERATIVE REPORT   PREOPERATIVE DIAGNOSIS:  Desires sterilization.  PROCEDURE:  Laparoscopic tubal ligation with bipolar cautery.  POSTOPERATIVE DIAGNOSIS:  Desires sterilization.  ANESTHESIA:  General.  ASSISTANT:  None.  DESCRIPTION OF PROCEDURE:  Under adequate general anesthesia, the patient was placed in the dorsal lithotomy position.  She was sterilely prepped and draped in the usual fashion.  The bladder was catheterized for small amount of urine.  Examination under anesthesia revealed anteverted uterus.  No adnexal masses could be appreciated.  Bivalve speculum placed in the vagina.  Single-tooth tenaculum was placed on anterior lip of the cervix.  An acorn cannula was introduced into the cervical os and attached to the tenaculum for manipulation of the uterus.  The bivalve speculum was then removed.  Attention was then turned to the abdomen where 0.25% Marcaine was injected infraumbilical. A small infraumbilical vertical incision was then made.  Veress needle was introduced tested with sterile normal saline.  A good water drop was noted, 2.7 L of CO2 was subsequently insufflated.  Veress needle was then removed.  A 10-mm disposable trocar with sleeve was introduced into the abdomen without incident.  A lighted videolaparoscope was then introduced confirming entry into the abdomen without incident. Panoramic inspection was notable for normal liver edge and gallbladder. Appendix not seen.  The patient was placed in Trendelenburg position.  A suprapubic incision was then made and a small 5 mm port site was then placed under direct visualization.  The pelvis was notable for  no endometriosis in the anterior and posterior cul-de-sac, elongated fallopian tubes bilaterally.  Both ovaries are normal.  Pelvic dominant vessels were noted bilaterally.  The midportion of both fallopian tubes was cauterized for least 1 cm bilaterally.  When the sterilization was felt to be adequate the suprapubic site was removed.  Abdomen was deflated.  The infraumbilical site was removed taking care not to bring up any underlying structures.  The incisions were then closed with 0 Vicryl for the fascial stitch and 4-0 Vicryl subcuticular closure.  The instruments in the vagina were removed.  SPECIMEN:  None.  ESTIMATED BLOOD LOSS:  3 mL.  COMPLICATION:  None.  The patient tolerated the procedure well and was transferred to recovery in stable condition.     [name], [name]   ______________________________ [name], [name]    [place]/MEDQ  D:  11/10/2015  T:  11/11/2015  Job:  [id number]

## 2015-11-11 NOTE — Op Note (Deleted)
  The note originally documented on this encounter has been moved the the encounter in which it belongs.  

## 2015-11-14 ENCOUNTER — Encounter (HOSPITAL_COMMUNITY): Payer: Self-pay | Admitting: Obstetrics and Gynecology

## 2016-02-29 DIAGNOSIS — Z1231 Encounter for screening mammogram for malignant neoplasm of breast: Secondary | ICD-10-CM | POA: Diagnosis not present

## 2016-02-29 DIAGNOSIS — Z048 Encounter for examination and observation for other specified reasons: Secondary | ICD-10-CM | POA: Diagnosis not present

## 2020-06-10 ENCOUNTER — Emergency Department
Admission: EM | Admit: 2020-06-10 | Discharge: 2020-06-10 | Discharge status: Absent without leave | Payer: BC Managed Care – PPO | Source: Home / Self Care

## 2020-06-10 ENCOUNTER — Other Ambulatory Visit: Payer: Self-pay

## 2020-06-11 ENCOUNTER — Emergency Department (INDEPENDENT_AMBULATORY_CARE_PROVIDER_SITE_OTHER)
Admission: EM | Admit: 2020-06-11 | Discharge: 2020-06-11 | Disposition: A | Discharge status: Home / Self Care | Payer: BC Managed Care – PPO | Source: Home / Self Care

## 2020-06-11 DIAGNOSIS — J039 Acute tonsillitis, unspecified: Secondary | ICD-10-CM

## 2020-06-11 LAB — POCT RAPID STREP A (OFFICE): Rapid Strep A Screen: NEGATIVE

## 2020-06-11 MED ORDER — AMOXICILLIN 875 MG PO TABS: 875.0000 mg | ORAL_TABLET | Freq: Two times a day (BID) | ORAL | 0 refills | Status: AC

## 2020-06-11 NOTE — ED Triage Notes (Signed)
Pt presents to Urgent Care with c/o sore throat, bilateral otalgia, and nasal congestion x 2-3 days.

## 2020-06-11 NOTE — ED Provider Notes (Signed)
[facility]    CSN: [id number] Arrival date & time: 06/11/20  0803      History   Chief Complaint Chief Complaint  Patient presents with  . Sore Throat  . Otalgia    Bilateral    HPI Robin Wagner is a 40 y.o. female.   HPI   Patient is here with a very painful sore throat.  Its been going on for 2 to 3 days.  Is getting worse.  She has a muffled voice.  She has tried Tylenol and ibuprofen for pain.  She states that these are not giving much relief.  Uncertain if she has had a fever.  Request strep test.  Strep test is negative.  She is not COVID vaccinated.  She has not been exposed to COVID.  She has no headache, body aches, fever or chills.  Past Medical History:  Diagnosis Date  . Medical history non-contributory     Patient Active Problem List   Diagnosis Date Noted  . Well woman exam with routine gynecological exam 11/03/2014  . Vaginal discharge 11/03/2014  . Alopecia, unspecified 09/12/2012  . Cervical cancer screening 07/17/2010  . Counseling on health promotion and disease prevention 07/17/2010  . Birth control counseling 07/17/2010    Past Surgical History:  Procedure Laterality Date  . HAND SURGERY Right   . LAPAROSCOPIC TUBAL LIGATION Bilateral 11/10/2015   Procedure: LAPAROSCOPIC TUBAL LIGATION With Bipolar Cautery;  Surgeon: Maxie Better, MD;  Location: [facility];  Service: Gynecology;  Laterality: Bilateral;  1 hr. DUE TO PATIENT'S RELIGION, SHE IS REQUESTING FEMALE ANESTHESIOLOGIST. ALSO FEMALE NURSING STAFF IF AT ALL POSSIBLE.  . TOOTH EXTRACTION      OB History   No obstetric history on file.      Home Medications    Prior to Admission medications   Medication Sig Start Date End Date Taking? Authorizing Provider  amoxicillin (AMOXIL) 875 MG tablet Take 1 tablet (875 mg total) by mouth 2 (two) times daily for 10 days. 06/11/20 06/21/20 Yes Eustace Moore, MD  Hyprom-Naphaz-Polysorb-Zn Sulf (CLEAR EYES COMPLETE OP) Apply 1  drop to eye daily as needed (FOR DRYNESS / REDNESS).    [provider]  ibuprofen (ADVIL,MOTRIN) 800 MG tablet Take 1 tablet (800 mg total) by mouth every 8 (eight) hours as needed. 11/10/15   Maxie Better, MD    Family History Family History  Problem Relation Age of Onset  . Heart Problems Mother   . Healthy Father     Social History Social History   Tobacco Use  . Smoking status: Never Smoker  . Smokeless tobacco: Never Used  Vaping Use  . Vaping Use: Never used  Substance Use Topics  . Alcohol use: No  . Drug use: No     Allergies   Patient has no known allergies.   Review of Systems Review of Systems See HPI  Physical Exam Triage Vital Signs ED Triage Vitals  Enc Vitals Group     BP 06/11/20 0825 122/75     Pulse Rate 06/11/20 0825 77     Resp 06/11/20 0825 18     Temp 06/11/20 0825 99.4 F (37.4 C)     Temp src --      SpO2 06/11/20 0825 99 %     Weight 06/11/20 0820 135 lb (61.2 kg)     Height 06/11/20 0820 5\' 5"  (1.651 m)     Head Circumference --      Peak Flow --  Pain Score 06/11/20 0820 10     Pain Loc --      Pain Edu? --      Excl. in GC? --    No data found.  Updated Vital Signs BP 122/75 (BP Location: Right Arm)   Pulse 77   Temp 99.4 F (37.4 C)   Resp 18   Ht 5\' 5"  (1.651 m)   Wt 61.2 kg   LMP 06/09/2020   SpO2 99%   BMI 22.47 kg/m     Physical Exam Constitutional:      General: She is not in acute distress.    Appearance: She is well-developed and normal weight.  HENT:     Head: Normocephalic and atraumatic.     Right Ear: Tympanic membrane and ear canal normal.     Left Ear: Tympanic membrane and ear canal normal.     Mouth/Throat:     Pharynx: Uvula midline. Posterior oropharyngeal erythema present.     Tonsils: Tonsillar exudate present. 1+ on the right. 1+ on the left.  Eyes:     Conjunctiva/sclera: Conjunctivae normal.     Pupils: Pupils are equal, round, and reactive to light.   Cardiovascular:     Rate and Rhythm: Normal rate.  Pulmonary:     Effort: Pulmonary effort is normal. No respiratory distress.  Abdominal:     General: There is no distension.     Palpations: Abdomen is soft.  Musculoskeletal:        General: Normal range of motion.     Cervical back: Normal range of motion.  Lymphadenopathy:     Cervical: Cervical adenopathy present.  Skin:    General: Skin is warm and dry.  Neurological:     Mental Status: She is alert.  Psychiatric:        Behavior: Behavior normal.      UC Treatments / Results  Labs (all labs ordered are listed, but only abnormal results are displayed) Labs Reviewed  CULTURE, GROUP A STREP  POCT RAPID STREP A (OFFICE)    EKG   Radiology No results found.  Procedures Procedures (including critical care time)  Medications Ordered in UC Medications - No data to display  Initial Impression / Assessment and Plan / UC Course  I have reviewed the triage vital signs and the nursing notes.  Pertinent labs & imaging results that were available during my care of the patient were reviewed by me and considered in my medical decision making (see chart for details).     I explained to the patient that although her strep test was negative XR inflamed with a large amount of exudate.  Throat is very painful.  We will cover her with antibiotics pending the throat culture.  She knows to check MyChart for her culture report in 2 to 3 days.  Symptomatic treatment reviewed Final Clinical Impressions(s) / UC Diagnoses   Final diagnoses:  Acute tonsillitis, unspecified etiology     Discharge Instructions     Drink plenty of fluids Salt water gargles may help with throat pain.  Sipping warm tea with honey helps.  Chloraseptic spray or lozenges will temporarily numb the throat May take Tylenol or ibuprofen for pain Take antibiotic 2 times a day.  Take 2 doses today You can see your throat culture report on MyChart   ED  Prescriptions    Medication Sig Dispense Auth. Provider   amoxicillin (AMOXIL) 875 MG tablet Take 1 tablet (875 mg total) by mouth 2 (two)  times daily for 10 days. 20 tablet Eustace Moore, MD     PDMP not reviewed this encounter.   Eustace Moore, MD 06/11/20 [phone]

## 2020-06-11 NOTE — Discharge Instructions (Addendum)
Drink plenty of fluids Salt water gargles may help with throat pain.  Sipping warm tea with honey helps.  Chloraseptic spray or lozenges will temporarily numb the throat May take Tylenol or ibuprofen for pain Take antibiotic 2 times a day.  Take 2 doses today You can see your throat culture report on MyChart

## 2020-06-14 LAB — CULTURE, GROUP A STREP
